# Patient Record
Sex: Male | Born: 1970 | Race: White | Hispanic: No | Marital: Single | Smoking: Former smoker
Health system: Southern US, Community
[De-identification: ages and names within clinical notes are randomized; demographics above are authoritative.]

## PROBLEM LIST (undated history)

## (undated) DIAGNOSIS — E119 Type 2 diabetes mellitus without complications: Secondary | ICD-10-CM

## (undated) DIAGNOSIS — K219 Gastro-esophageal reflux disease without esophagitis: Secondary | ICD-10-CM

## (undated) DIAGNOSIS — F329 Major depressive disorder, single episode, unspecified: Secondary | ICD-10-CM

## (undated) DIAGNOSIS — F419 Anxiety disorder, unspecified: Secondary | ICD-10-CM

## (undated) DIAGNOSIS — Z87442 Personal history of urinary calculi: Secondary | ICD-10-CM

## (undated) DIAGNOSIS — E785 Hyperlipidemia, unspecified: Secondary | ICD-10-CM

## (undated) DIAGNOSIS — R42 Dizziness and giddiness: Secondary | ICD-10-CM

## (undated) DIAGNOSIS — R0602 Shortness of breath: Secondary | ICD-10-CM

## (undated) DIAGNOSIS — F32A Depression, unspecified: Secondary | ICD-10-CM

## (undated) DIAGNOSIS — H8109 Meniere's disease, unspecified ear: Secondary | ICD-10-CM

## (undated) DIAGNOSIS — T4145XA Adverse effect of unspecified anesthetic, initial encounter: Secondary | ICD-10-CM

## (undated) HISTORY — DX: Hyperlipidemia, unspecified: E78.5

## (undated) HISTORY — DX: Personal history of urinary calculi: Z87.442

## (undated) HISTORY — DX: Dizziness and giddiness: R42

## (undated) HISTORY — DX: Major depressive disorder, single episode, unspecified: F32.9

## (undated) HISTORY — PX: HEMORROIDECTOMY: SUR656

## (undated) HISTORY — DX: Depression, unspecified: F32.A

## (undated) HISTORY — PX: EXTERNAL EAR SURGERY: SHX627

## (undated) HISTORY — DX: Anxiety disorder, unspecified: F41.9

## (undated) HISTORY — DX: Type 2 diabetes mellitus without complications: E11.9

---

## 1999-04-06 ENCOUNTER — Emergency Department (HOSPITAL_COMMUNITY): Admission: EM | Admit: 1999-04-06 | Discharge: 1999-04-07 | Payer: Self-pay | Admitting: Emergency Medicine

## 1999-04-06 ENCOUNTER — Encounter: Payer: Self-pay | Admitting: Emergency Medicine

## 1999-06-01 ENCOUNTER — Emergency Department (HOSPITAL_COMMUNITY): Admission: EM | Admit: 1999-06-01 | Discharge: 1999-06-01 | Payer: Self-pay | Admitting: Emergency Medicine

## 1999-06-13 ENCOUNTER — Emergency Department (HOSPITAL_COMMUNITY): Admission: EM | Admit: 1999-06-13 | Discharge: 1999-06-13 | Payer: Self-pay | Admitting: Emergency Medicine

## 2000-02-24 HISTORY — PX: ANAL FISSURE REPAIR: SHX2312

## 2000-07-26 HISTORY — PX: OTHER SURGICAL HISTORY: SHX169

## 2002-09-21 ENCOUNTER — Emergency Department (HOSPITAL_COMMUNITY): Admission: EM | Admit: 2002-09-21 | Discharge: 2002-09-21 | Payer: Self-pay | Admitting: Emergency Medicine

## 2002-09-21 ENCOUNTER — Encounter: Payer: Self-pay | Admitting: Emergency Medicine

## 2002-09-25 ENCOUNTER — Emergency Department (HOSPITAL_COMMUNITY): Admission: EM | Admit: 2002-09-25 | Discharge: 2002-09-25 | Payer: Self-pay | Admitting: Emergency Medicine

## 2003-06-26 DIAGNOSIS — T8859XA Other complications of anesthesia, initial encounter: Secondary | ICD-10-CM

## 2003-06-26 HISTORY — DX: Other complications of anesthesia, initial encounter: T88.59XA

## 2003-09-08 ENCOUNTER — Emergency Department (HOSPITAL_COMMUNITY): Admission: EM | Admit: 2003-09-08 | Discharge: 2003-09-08 | Payer: Self-pay | Admitting: Emergency Medicine

## 2004-03-25 ENCOUNTER — Emergency Department (HOSPITAL_COMMUNITY): Admission: EM | Admit: 2004-03-25 | Discharge: 2004-03-25 | Payer: Self-pay | Admitting: Emergency Medicine

## 2004-07-31 ENCOUNTER — Emergency Department: Payer: Self-pay | Admitting: Emergency Medicine

## 2004-08-02 ENCOUNTER — Ambulatory Visit: Payer: Self-pay | Admitting: Emergency Medicine

## 2005-01-19 ENCOUNTER — Ambulatory Visit: Payer: Self-pay | Admitting: Unknown Physician Specialty

## 2006-08-18 ENCOUNTER — Emergency Department (HOSPITAL_COMMUNITY): Admission: EM | Admit: 2006-08-18 | Discharge: 2006-08-18 | Payer: Self-pay | Admitting: Emergency Medicine

## 2006-08-23 ENCOUNTER — Ambulatory Visit: Payer: Self-pay | Admitting: Specialist

## 2006-08-23 HISTORY — PX: BRONCHOSCOPY: SUR163

## 2007-02-27 ENCOUNTER — Emergency Department: Payer: Self-pay | Admitting: Unknown Physician Specialty

## 2007-02-27 ENCOUNTER — Other Ambulatory Visit: Payer: Self-pay

## 2007-03-08 ENCOUNTER — Emergency Department: Payer: Self-pay | Admitting: Unknown Physician Specialty

## 2007-03-24 ENCOUNTER — Emergency Department: Payer: Self-pay | Admitting: Emergency Medicine

## 2007-03-31 ENCOUNTER — Emergency Department (HOSPITAL_COMMUNITY): Admission: EM | Admit: 2007-03-31 | Discharge: 2007-03-31 | Payer: Self-pay | Admitting: Emergency Medicine

## 2007-04-03 ENCOUNTER — Ambulatory Visit: Payer: Self-pay | Admitting: Gastroenterology

## 2007-04-15 ENCOUNTER — Encounter: Payer: Self-pay | Admitting: Gastroenterology

## 2007-04-15 ENCOUNTER — Ambulatory Visit (HOSPITAL_COMMUNITY): Admission: RE | Admit: 2007-04-15 | Discharge: 2007-04-15 | Payer: Self-pay | Admitting: Gastroenterology

## 2007-04-15 ENCOUNTER — Ambulatory Visit: Payer: Self-pay | Admitting: Gastroenterology

## 2007-04-15 HISTORY — PX: ESOPHAGOGASTRODUODENOSCOPY: SHX1529

## 2007-11-04 ENCOUNTER — Emergency Department: Payer: Self-pay | Admitting: Emergency Medicine

## 2007-12-19 ENCOUNTER — Emergency Department: Payer: Self-pay | Admitting: Emergency Medicine

## 2008-01-19 ENCOUNTER — Emergency Department (HOSPITAL_COMMUNITY): Admission: EM | Admit: 2008-01-19 | Discharge: 2008-01-19 | Payer: Self-pay | Admitting: Emergency Medicine

## 2008-01-22 ENCOUNTER — Emergency Department: Payer: Self-pay

## 2008-02-28 ENCOUNTER — Emergency Department: Payer: Self-pay

## 2008-03-18 ENCOUNTER — Ambulatory Visit (HOSPITAL_COMMUNITY): Admission: RE | Admit: 2008-03-18 | Discharge: 2008-03-18 | Payer: Self-pay | Admitting: Urology

## 2008-07-25 ENCOUNTER — Emergency Department: Payer: Self-pay | Admitting: Emergency Medicine

## 2009-05-06 ENCOUNTER — Emergency Department: Payer: Self-pay | Admitting: Emergency Medicine

## 2009-06-20 ENCOUNTER — Emergency Department: Payer: Self-pay | Admitting: Emergency Medicine

## 2010-10-13 ENCOUNTER — Emergency Department (HOSPITAL_COMMUNITY)
Admission: EM | Admit: 2010-10-13 | Discharge: 2010-10-13 | Disposition: A | Discharge status: Home / Self Care | Payer: Medicare Other | Attending: Emergency Medicine | Admitting: Emergency Medicine

## 2010-10-13 DIAGNOSIS — W260XXA Contact with knife, initial encounter: Secondary | ICD-10-CM | POA: Insufficient documentation

## 2010-10-13 DIAGNOSIS — S61209A Unspecified open wound of unspecified finger without damage to nail, initial encounter: Secondary | ICD-10-CM | POA: Insufficient documentation

## 2010-10-13 DIAGNOSIS — W261XXA Contact with sword or dagger, initial encounter: Secondary | ICD-10-CM | POA: Insufficient documentation

## 2010-10-23 ENCOUNTER — Emergency Department (HOSPITAL_COMMUNITY)
Admission: EM | Admit: 2010-10-23 | Discharge: 2010-10-23 | Disposition: A | Discharge status: Home / Self Care | Payer: Medicare Other | Attending: Emergency Medicine | Admitting: Emergency Medicine

## 2010-10-23 DIAGNOSIS — H8109 Meniere's disease, unspecified ear: Secondary | ICD-10-CM | POA: Insufficient documentation

## 2010-10-23 DIAGNOSIS — R319 Hematuria, unspecified: Secondary | ICD-10-CM | POA: Insufficient documentation

## 2010-10-23 DIAGNOSIS — R300 Dysuria: Secondary | ICD-10-CM | POA: Insufficient documentation

## 2010-10-23 DIAGNOSIS — K5289 Other specified noninfective gastroenteritis and colitis: Secondary | ICD-10-CM | POA: Insufficient documentation

## 2010-10-23 LAB — URINALYSIS, ROUTINE W REFLEX MICROSCOPIC
Leukocytes, UA: NEGATIVE
Nitrite: NEGATIVE
Specific Gravity, Urine: 1.03 (ref 1.005–1.030)
Urobilinogen, UA: 0.2 mg/dL (ref 0.0–1.0)
pH: 6 (ref 5.0–8.0)

## 2010-10-23 LAB — URINE MICROSCOPIC-ADD ON

## 2010-11-07 NOTE — H&P (Signed)
NAME:  Edwin Norton, Edwin Norton NO.:  [id number]   MEDICAL RECORD NO.:  [id number]          PATIENT TYPE:  AMB   LOCATION:  DAY                           FACILITY:  [facility]   PHYSICIAN:  Kassie Mends, M.D.      DATE OF BIRTH:  [birth year]   DATE OF ADMISSION:  DATE OF DISCHARGE:  LH                              HISTORY & PHYSICAL   CHIEF COMPLAINT:  1. Abdominal pain.  2. Nausea.  3. Vomiting.   HISTORY OF PRESENT ILLNESS:  Fredric is a 40 year old Caucasian gentleman  who presents with a 35-month history of burning upper abdominal pain.  He  states he has been seen in the emergency department at [facility] 3 times over the last month and was seen 3 days ago at [facility] emergency department.  He states he has had a negative CT and  ultrasound and lab work.  He was placed on Protonix 40 mg daily some  time in the last few weeks.  He has not noticed any improvement.  He  states he has only been able to eat solid food about 6 times in the last  4 weeks.  He has lost over 10 pounds.  He describes having this  epigastric burning type pain constantly.  He develops postprandial  vomiting frequently.  Denies any dysphagia or odynophagia.  He states  for about a year he has had postprandial urgency and frequent stools.  He has had reflux on and off in the past which he self-medicated with  Prilosec OTC with good results.  In February he states he had  hematemesis and was seen in the emergency department.  He describes  having an upper endoscopy and states this was done in [place].  He does  not recall the results.  He has never had a colonoscopy.  Denies any  melena.  He has a remote history of bright red blood per rectum which he  blames on hemorrhoids.  He has had a prior hemorrhoidectomy.  His stools  are generally regular.  He has not had as frequent stools lately, but he  is not eating that much.  He states that he is only able to keep  Gatorade down.  Denies  any urinary symptoms.  On March 31, 2007, he had  a hemoglobin of 16.7, white count 7000, platelet count 242,000.  MET 7  normal.  Total bilirubin 1.9.  Other LFT parameters normal.  Lipase was  31.  He had abdominal films which were unremarkable.   CURRENT MEDICATIONS:  1. Protonix 40 mg daily.  2. Percocet 5/325 mg every 4 to 6 hours as needed.  3. Xanax, generally takes 0.25 mg twice a day as needed.  Currently,      he ran out yesterday.   ALLERGIES:  No known drug allergies.   PAST MEDICAL HISTORY:  1. Meniere's disease diagnosed 5 to 6 years ago.  He is on disability      due to this.  2. Frequent dizziness.  3. Depression.  4. Anxiety.  5. History of  nephrolithiasis.  6. Surgery on his right ear.  7. Hemorrhoidectomy.   FAMILY HISTORY:  1. Mother has cirrhosis.  She is not real sure of the etiology.  She      is a patient of ours apparently, Matthew Cina, but has not been      seen in awhile.  2. Father deceased age 83 due to lung problems.  He also had peptic      ulcer disease.  3. No family history of colorectal cancer or inflammatory bowel      disease.   SOCIAL HISTORY:  1. He is single.  2. He is on disability.  3. He quit smoking 5 years ago.  4. He has been chewing tobacco for 2 years.  5. No alcohol use.   REVIEW OF SYSTEMS:  See HPI for GI.  CONSTITUTIONAL/CARDIOPULMONARY:  Denies chest pain, shortness of breath, cough or palpitations.  See HPI  for GU.   PHYSICAL EXAM:  Weight 202, height 5 feet 7 inches, temp 98.1, blood  pressure 110/82, pulse 52.  GENERAL:  A pleasant, somewhat anxious, young Caucasian male in no acute  distress.  Skin warm and dry, no jaundice.  HEENT:  Sclera nonicteric.  Oropharyngeal mucosa moist and pink.  No  lesions, erythema or exudate.  No lymphadenopathy, thyromegaly.  CHEST:  Lungs are clear to auscultation.  CARDIAC EXAM:  Regular rate and rhythm, normal S1 S2.  No murmurs, rubs  or gallops.  ABDOMEN:  Positive  bowel sounds.  Abdomen soft, nondistended.  He has  mild epigastric tenderness as well as tenderness pretty much throughout  the entire upper abdomen and with mid abdomen to deep palpation.  No  organomegaly or masses, no rebound tenderness or guarding.  No abdominal  bruits or hernias.  EXTREMITIES:  No edema.   IMPRESSION:  Hrithik is a 40 year old gentleman with a one month history  of upper abdominal burning associated with postprandial nausea and  vomiting.  He describes having unremarkable abdominal ultrasound and CT  of the abdomen as well as laboratories.  He has not had a response to  proton pump inhibitor thus far.  In February, I find it interesting that  he had hematemesis.  He describes having upper endoscopy and will try  and track down some records.  He definitely has a postprandial component  to his symptoms but complains of constant abdominal pain.  Symptoms not  entirely consistent with biliary disease, but cannot exclude biliary  dyskinesia.  Differential also includes gastritis, dyspepsia, peptic  ulcer disease.  Weight loss is concerning.  Would recommend EGD for  further evaluation.  At this point in time I really see no indication  for colonoscopy.   PLAN:  1. EGD in the near future by Dr. Cira Servant.  2. Retrieve records from [facility] including recent CT of the      abdomen and pelvis, abdominal ultrasound and if he has had an EGD      back earlier in the year.  3. Increase his Protonix to 40 mg b.i.d.  4. Refill his Percocet 5/325 mg, take one every 4 to 6 hours as      needed, #10, zero refills.  5. I elected to refill his Xanax, as he is completely out and has no      primary care physician.  He typically gets this from his ENT      doctor.  Xanax 0.25 mg, take one p.o. b.i.d. p.r.n., #30, zero  refills.  6. Further recommendations to follow.   ADDENDUM:  Mr. Matters should establish care with a primary physician. We  will not provide additional  Percocet or Xanax.      Tana Coast, P.A.      Kassie Mends, M.D.  Electronically Signed    LL/MEDQ  D:  04/03/2007  T:  04/03/2007  Job:  [id number]

## 2010-11-07 NOTE — Op Note (Signed)
NAME:  Edwin Norton, Edwin Norton NO.:  [id number]   MEDICAL RECORD NO.:  [id number]          PATIENT TYPE:  AMB   LOCATION:  DAY                           FACILITY:  [facility]   PHYSICIAN:  Kassie Mends, M.D.      DATE OF BIRTH:  18-Mar-1971   DATE OF PROCEDURE:  04/15/2007  DATE OF DISCHARGE:                               OPERATIVE REPORT   PROCEDURE:  Esophagogastroduodenoscopy with cold forceps biopsy.   INDICATION FOR EXAM:  Mr. Isabell is a 40 year old male who complains of  nausea, vomiting, abdominal pain.  He presented today with right sided  abdominal pain and mild chest pain.   FINDINGS:  1. Patchy erythema in antrum.  Biopsies obtained to evaluate for H.      pylori gastritis.  Otherwise normal stomach.  2. Normal esophagus without evidence of Barrett's mass, stricture,      erosion or ulceration.  3.  Normal duodenal bulb and second portion      of duodenum.   RECOMMENDATIONS:  1. Begin Prilosec 20 mg daily.  2. Avoid gastric irritants.  He was given a handout on gastric      irritants and gastritis.  3. We will call with the results of his biopsies to evaluate for H.      pylori gastritis or eosinophilic gastritis.  4. No aspirin or NSAIDs or anticoagulation for 5 days.  5. Follow up with Tana Coast in 6 weeks to reevaluate his vomiting,      and abdominal pain.  His vomiting and abdominal pain may be due to      gastroesophageal reflux disease and/or gastritis and has a low      likelihood of a biliary or pancreatic source.   MEDICATIONS:  1. Demerol 50 mg IV.  2. Versed 4 mg IV.  3. Phenergan 12.5 mg IV.   PROCEDURE TECHNIQUE:  Physical exam was performed.  Informed consent was  obtained from the patient after explaining benefits, risks and  alternatives to the procedure.  The patient was connected to monitor and  placed in left lateral position.  Continuous oxygen was provided by  nasal cannula and IV medicine administered through an indwelling  cannula.  After administration of sedation, the patient's esophagus  intubated.  The scope was advanced  under direct visualization to the second portion of the duodenum.  The  scope was removed slowly by carefully examining the color, texture,  anatomy and integrity of the mucosa on the way out.  The patient was  recovered in endoscopy and discharged home in satisfactory condition.      Kassie Mends, M.D.  Electronically Signed     SM/MEDQ  D:  04/15/2007  T:  04/16/2007  Job:  [id number]

## 2011-03-23 LAB — GC/CHLAMYDIA PROBE AMP, GENITAL: GC Probe Amp, Genital: NEGATIVE

## 2011-04-04 LAB — HEPATIC FUNCTION PANEL
Albumin: 3.8
Alkaline Phosphatase: 46
Bilirubin, Direct: <0.1
Total Bilirubin: 0.7

## 2011-04-05 LAB — URINALYSIS, ROUTINE W REFLEX MICROSCOPIC
Glucose, UA: NEGATIVE
Ketones, ur: 40 — AB
Protein, ur: NEGATIVE
Urobilinogen, UA: 0.2

## 2011-04-05 LAB — CBC
HCT: 48.9
MCV: 85.3
Platelets: 242
RDW: 13

## 2011-04-05 LAB — COMPREHENSIVE METABOLIC PANEL
Albumin: 4.6
BUN: 10
Creatinine, Ser: 1.15
Potassium: 4.7
Total Protein: 7.5

## 2011-04-05 LAB — DIFFERENTIAL
Lymphocytes Relative: 34
Monocytes Absolute: 0.6
Monocytes Relative: 9
Neutro Abs: 3.9

## 2011-04-05 LAB — PROTIME-INR: INR: 0.9

## 2011-05-13 ENCOUNTER — Emergency Department (HOSPITAL_COMMUNITY)
Admission: EM | Admit: 2011-05-13 | Discharge: 2011-05-13 | Disposition: A | Discharge status: Home / Self Care | Payer: Medicare Other | Attending: Emergency Medicine | Admitting: Emergency Medicine

## 2011-05-13 DIAGNOSIS — H8109 Meniere's disease, unspecified ear: Secondary | ICD-10-CM | POA: Insufficient documentation

## 2011-05-13 DIAGNOSIS — T1590XA Foreign body on external eye, part unspecified, unspecified eye, initial encounter: Secondary | ICD-10-CM | POA: Insufficient documentation

## 2011-05-13 DIAGNOSIS — Z23 Encounter for immunization: Secondary | ICD-10-CM | POA: Insufficient documentation

## 2011-05-13 HISTORY — DX: Meniere's disease, unspecified ear: H81.09

## 2011-05-13 MED ORDER — TOBRAMYCIN 0.3 % OP SOLN
1.0000 [drp] | Freq: Once | OPHTHALMIC | Status: AC
  Administered 2011-05-13: 1 [drp] via OPHTHALMIC

## 2011-05-13 MED ORDER — TETANUS-DIPHTH-ACELL PERTUSSIS 5-2.5-18.5 LF-MCG/0.5 IM SUSP
0.5000 mL | Freq: Once | INTRAMUSCULAR | Status: AC
  Administered 2011-05-13: 0.5 mL via INTRAMUSCULAR
  Filled 2011-05-13: qty 0.5

## 2011-05-13 MED ORDER — FLUORESCEIN SODIUM 1 MG OP STRP
1.0000 | ORAL_STRIP | Freq: Once | OPHTHALMIC | Status: AC
  Administered 2011-05-13: 21:00:00 via OPHTHALMIC

## 2011-05-13 MED ORDER — TETRACAINE HCL 0.5 % OP SOLN
2.0000 [drp] | Freq: Once | OPHTHALMIC | Status: AC
  Administered 2011-05-13: 2 [drp] via OPHTHALMIC
  Filled 2011-05-13: qty 2

## 2011-05-13 NOTE — ED Notes (Signed)
Pt presents with left eye pain. Eye is red and swollen. Pt states he was working under a car and thinks he may have a piece of metal in eye.

## 2011-05-13 NOTE — ED Notes (Signed)
Pt states was grinding metal while fixing a car and he felt "something go in the left eye".  Pt states that was at 0530 this morning, he went to sleep hoping it would come out.  Pt's left eye is swollen and red.

## 2011-05-13 NOTE — ED Provider Notes (Signed)
History     CSN: [id number] Arrival date & time: 05/13/2011  7:36 PM   First MD Initiated Contact with Patient 05/13/11 1942      Chief Complaint  Patient presents with  . Foreign Body in Eye    (Consider location/radiation/quality/duration/timing/severity/associated sxs/prior treatment) HPI Comments: Pt was working underneath a vehicle last PM and got something in his L eye.  Patient is a 40 y.o. male presenting with foreign body in eye. The history is provided by the patient. No language interpreter was used.  Foreign Body in Eye This is a new problem. The current episode started yesterday. The problem occurs constantly. Exacerbated by: blinking. Treatments tried: flushing eye with water. The treatment provided no relief.    Past Medical History  Diagnosis Date  . Meniere disease     History reviewed. No pertinent past surgical history.  No family history on file.  History  Substance Use Topics  . Smoking status: Never Smoker   . Smokeless tobacco: Not on file  . Alcohol Use: No      Review of Systems  Eyes: Positive for pain and redness. Negative for discharge, itching and visual disturbance.  All other systems reviewed and are negative.    Allergies  Percocet and Vicodin  Home Medications  No current outpatient prescriptions on file.  BP 131/87  Pulse 85  Temp(Src) 98.2 F (36.8 C) (Oral)  Resp 18  Ht 5\' 4"  (1.626 m)  Wt 220 lb (99.791 kg)  BMI 37.76 kg/m2  SpO2 100%  Physical Exam  Nursing note and vitals reviewed. Constitutional: He is oriented to person, place, and time. He appears well-developed and well-nourished.  HENT:  Head: Normocephalic and atraumatic.  Eyes: EOM are normal. Pupils are equal, round, and reactive to light. Right eye exhibits no discharge, no exudate and no hordeolum. Left eye exhibits no discharge, no exudate and no hordeolum. Foreign body present in the left eye. Right conjunctiva is not injected. Right conjunctiva has  no hemorrhage. Left conjunctiva is injected. Left conjunctiva has no hemorrhage. No scleral icterus. Pupils are equal.    Neck: Normal range of motion.  Cardiovascular: Normal rate, regular rhythm, normal heart sounds and intact distal pulses.   Pulmonary/Chest: Effort normal and breath sounds normal. No respiratory distress.  Abdominal: Soft. He exhibits no distension. There is no tenderness.  Musculoskeletal: Normal range of motion.  Neurological: He is alert and oriented to person, place, and time.  Skin: Skin is warm and dry.  Psychiatric: He has a normal mood and affect. Judgment normal.    ED Course  Procedures (including critical care time)  Labs Reviewed - No data to display No results found.   No diagnosis found.    MDM          Worthy Rancher, PA 05/13/11 2128

## 2011-05-14 NOTE — ED Provider Notes (Signed)
Medical screening examination/treatment/procedure(s) were performed by non-physician practitioner and as supervising physician I was immediately available for consultation/collaboration. Devoria Albe, MD, Armando Gang   Ward Givens, MD 05/14/11 [phone]

## 2011-05-15 ENCOUNTER — Emergency Department: Payer: Self-pay | Admitting: Emergency Medicine

## 2011-05-31 ENCOUNTER — Encounter: Payer: Self-pay | Admitting: Gastroenterology

## 2011-05-31 ENCOUNTER — Ambulatory Visit (INDEPENDENT_AMBULATORY_CARE_PROVIDER_SITE_OTHER): Payer: Medicare Other | Admitting: Gastroenterology

## 2011-05-31 ENCOUNTER — Telehealth: Payer: Self-pay | Admitting: Gastroenterology

## 2011-05-31 VITALS — BP 131/88 | HR 77 | Temp 98.5°F | Ht 66.0 in | Wt 220.8 lb

## 2011-05-31 DIAGNOSIS — K219 Gastro-esophageal reflux disease without esophagitis: Secondary | ICD-10-CM

## 2011-05-31 DIAGNOSIS — K644 Residual hemorrhoidal skin tags: Secondary | ICD-10-CM

## 2011-05-31 MED ORDER — HYDROCORTISONE 2.5 % RE CREA: TOPICAL_CREAM | RECTAL | Status: DC

## 2011-05-31 MED ORDER — OMEPRAZOLE 20 MG PO CPDR: 20.0000 mg | DELAYED_RELEASE_CAPSULE | Freq: Every day | ORAL | Status: DC

## 2011-05-31 NOTE — Progress Notes (Signed)
Primary Care Physician:  No primary provider on file. Primary Gastroenterologist:  Dr. Darrick Penna  Chief Complaint  Patient presents with  . Rectal Pain    HPI:   Edwin Norton is a 40 year old male who presents today secondary to rectal discomfort, itching, possible hemorrhoids. He reports a history of 2 procedures in the remote past at [facility] secondary to this same issue. 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 He denies any hematochezia. States rectum itches, painful to sit. States feels like something is sticking out of him. Has 1-3 stools per day, which is his baseline. Notes that this has been getting larger/longer over the years.  Also reports intermittent reflux, some at night, is up about 20 lbs per his report. Denies any N/V, dysphagia. Last EGD in 2008 as reported below. Taking Tagament prn, no PPI currently.   Past Medical History  Diagnosis Date  . Meniere disease   . Meniere disease   . Dizziness   . Depression   . Anxiety   . History of nephrolithiasis     Past Surgical History  Procedure Date  . Esophagogastroduodenoscopy 04/15/07    patchy erythema in antrum, negative H.pylori   . External ear surgery     right  . Hemorroidectomy   . Bronchoscopy 08/23/06    erythema was found in the left lower lobe  . Anal fissure repair Sept 2001    Dr. Theodis Sato, [facility]  . Debridment of chronic granulation tissue and seton placement Feb 2002    Dr. Theodis Sato, [facility], noted suprasphincteric fistula, with seton placement     Current Outpatient Prescriptions  Medication Sig Dispense Refill  . hydrocortisone (ANUSOL-HC) 2.5 % rectal cream Apply rectally 2 times daily  30 g  1  . omeprazole (PRILOSEC) 20 MG capsule Take 1 capsule (20 mg total) by mouth daily. Take 30 min before breakfast  30 capsule  1    Allergies as of 05/31/2011 -  Review Complete 05/31/2011  Allergen Reaction Noted  . Percocet (oxycodone-acetaminophen) Itching 05/13/2011  . Vicodin (hydrocodone-acetaminophen) Itching 05/13/2011    Family History  Problem Relation Age of Onset  . Colon cancer Neg Hx     History   Social History  . Marital Status: Single    Spouse Name: N/A    Number of Children: N/A  . Years of Education: N/A   Occupational History  . Not on file.   Social History Main Topics  . Smoking status: Never Smoker   . Smokeless tobacco: Not on file  . Alcohol Use: No  . Drug Use: No  . Sexually Active: Not on file   Other Topics Concern  . Not on file   Social History Narrative  . No narrative on file    Review of Systems: Gen: Denies any fever, chills, fatigue, weight loss, lack of appetite.  CV: Denies chest pain, heart palpitations, peripheral edema, syncope.  Resp: Denies shortness of breath at rest or with exertion. Denies wheezing or cough.  GI: Denies dysphagia or odynophagia. Denies jaundice, hematemesis, fecal incontinence. GU : Denies urinary burning, urinary frequency, urinary hesitancy MS: Denies joint pain, muscle weakness, cramps, or limitation of movement.  Derm: Denies rash, itching, dry skin Psych: Denies depression, anxiety, memory loss, and confusion Heme: Denies bruising, bleeding, and enlarged lymph nodes.  Physical Exam: BP 131/88  Pulse 77  Temp(Src) 98.5 F (36.9 C) (  Temporal)  Ht 5\' 6"  (1.676 m)  Wt 220 lb 12.8 oz (100.154 kg)  BMI 35.64 kg/m2 General:   Alert and oriented. Pleasant and cooperative. Well-nourished and well-developed.  Head:  Normocephalic and atraumatic. Eyes:  Without icterus, sclera clear and conjunctiva pink.  Ears:  Normal auditory acuity. Nose:  No deformity, discharge,  or lesions. Mouth:  No deformity or lesions, oral mucosa pink.  Neck:  Supple, without mass or thyromegaly. Lungs:  Clear to auscultation bilaterally. No wheezes, rales, or rhonchi. No  distress.  Heart:  S1, S2 present without murmurs appreciated.  Abdomen:  +BS, soft, non-tender and non-distended. No HSM noted. No guarding or rebound. No masses appreciated.  Rectal:  External exam with interesting finding of perianal growth, non-thrombosed, posterior and left of midline, with skin-tag like attachment to perianal area, on end size of large pea, ball-shaped, tender to touch, appears to have small scabs noted. Internal exam without mass noted, no significant discomfort, no gross blood noted.  Msk:  Symmetrical without gross deformities. Normal posture. Pulses:  Normal pulses noted. Extremities:  Without clubbing or edema. Neurologic:  Alert and  oriented x4;  grossly normal neurologically. Skin:  Intact without significant lesions or rashes. Cervical Nodes:  No significant cervical adenopathy. Psych:  Alert and cooperative. Normal mood and affect.

## 2011-05-31 NOTE — Patient Instructions (Signed)
Start taking Prilosec each morning, 30 minutes before breakfast.   Use the anusol cream twice a day. We have referred you to Dr. Lovell Sheehan for further assessment.  We are retrieving the notes from the past procedures you have had. If there have been some type of fistulas in the past, we may need to do a colonoscopy in the future.

## 2011-06-01 NOTE — Telephone Encounter (Signed)
Opened on era

## 2011-06-02 ENCOUNTER — Encounter: Payer: Self-pay | Admitting: Gastroenterology

## 2011-06-02 DIAGNOSIS — K644 Residual hemorrhoidal skin tags: Secondary | ICD-10-CM | POA: Insufficient documentation

## 2011-06-02 DIAGNOSIS — K219 Gastro-esophageal reflux disease without esophagitis: Secondary | ICD-10-CM | POA: Insufficient documentation

## 2011-06-02 NOTE — Assessment & Plan Note (Signed)
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 States painful to sit, no bleeding noted. Physical exam as outlined above. Will likely need procedure to remove this; due to hx of fistula in past, may need colonoscopy for evaluation of lower GI tract in future.   Will refer to Dr. Lovell Sheehan for assessment Brief course of anusol in interim for symptomatic relief F/U in 4 weeks for reassessment, consideration of colonoscopy

## 2011-06-02 NOTE — Assessment & Plan Note (Signed)
Hx of GERD, last EGD in 2008, no H.pylori noted. Notes nocturnal reflux, no PPI currently. Per pt has gained upwards of 20 lbs. Will place on Prilosec, follow-up in 4 weeks for reassessment. Discussed wt loss with pt as well.

## 2011-06-04 ENCOUNTER — Telehealth: Payer: Self-pay | Admitting: Gastroenterology

## 2011-06-04 NOTE — Progress Notes (Signed)
No PCP on file 

## 2011-06-04 NOTE — Telephone Encounter (Signed)
Received confirmation fax- pt is scheduled to see Dr Lovell Sheehan on 12/13 @ 10:45

## 2011-06-04 NOTE — Telephone Encounter (Signed)
Referral and notes faxed to Dr Jenkins 

## 2011-06-05 NOTE — Progress Notes (Signed)
REVIEWED.  EGD IF SX NOT IMPROVED ON PRILOSEC. LOW FAT DIET. LOSE 10-20 LBS.

## 2011-08-23 ENCOUNTER — Ambulatory Visit: Payer: Self-pay | Admitting: Unknown Physician Specialty

## 2011-09-19 ENCOUNTER — Ambulatory Visit: Payer: Self-pay | Admitting: Unknown Physician Specialty

## 2011-10-18 ENCOUNTER — Telehealth: Payer: Self-pay | Admitting: Gastroenterology

## 2011-10-18 ENCOUNTER — Encounter: Payer: Self-pay | Admitting: Urgent Care

## 2011-10-18 NOTE — Telephone Encounter (Signed)
Called pt to get more info. ( He has not been seen since Dec 2012). LMOM for a return call.

## 2011-10-18 NOTE — Telephone Encounter (Signed)
Pt's wife called back and said pt is having some rectal pain and it feels like something is crawling in his rectum. OV scheduled with KJ on 10/19/2011 @ 9:00 AM.

## 2011-10-18 NOTE — Telephone Encounter (Signed)
Patient is asking for a refill for Anusol until he can be seen in May with Dr. Darrick Penna please advise?

## 2011-10-19 ENCOUNTER — Ambulatory Visit: Payer: Medicare Other | Admitting: Urgent Care

## 2011-10-19 ENCOUNTER — Telehealth: Payer: Self-pay | Admitting: Urgent Care

## 2011-10-19 NOTE — Telephone Encounter (Signed)
Noted  

## 2011-10-19 NOTE — Telephone Encounter (Signed)
Pt was a no show

## 2011-10-22 ENCOUNTER — Encounter: Payer: Self-pay | Admitting: Urgent Care

## 2011-10-22 ENCOUNTER — Ambulatory Visit (INDEPENDENT_AMBULATORY_CARE_PROVIDER_SITE_OTHER): Payer: Medicare Other | Admitting: Urgent Care

## 2011-10-22 VITALS — BP 117/69 | HR 70 | Temp 98.3°F | Ht 67.0 in | Wt 229.8 lb

## 2011-10-22 DIAGNOSIS — L259 Unspecified contact dermatitis, unspecified cause: Secondary | ICD-10-CM

## 2011-10-22 DIAGNOSIS — L309 Dermatitis, unspecified: Secondary | ICD-10-CM

## 2011-10-22 MED ORDER — HYDROCORTISONE 2.5 % RE CREA: TOPICAL_CREAM | RECTAL | Status: DC

## 2011-10-22 NOTE — Progress Notes (Signed)
No PCP on file 

## 2011-10-22 NOTE — Progress Notes (Signed)
Primary Care Physician:  n/a Primary Gastroenterologist:  Dr. Darrick Penna  Chief Complaint  Patient presents with  . Rectal Pain    HPI:  Edwin Norton is a 41 y.o. male here for follow up for anal itching. He was seen by Gerrit Halls, nurse practitioner in December 2012. He was found to have a perianal skin tag and was sent to Dr. Lovell Sheehan however he admits to not following through with this referral. He states the Anusol cream helped significantly with his problems, but he ran out. He complains of intense anal pruritus. He began to have problems one week ago. He describes symptoms as constant. He denies any rectal bleeding or melena. He denies any change in his bowel habits.  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 He denies weight or appetite changes. No family history of colorectal carcinoma. Past Medical History  Diagnosis Date  . Meniere disease   . Meniere disease   . Dizziness   . Depression   . Anxiety   . History of nephrolithiasis     Past Surgical History  Procedure Date  . Esophagogastroduodenoscopy 04/15/07    patchy erythema in antrum, negative H.pylori   . External ear surgery     right  . Hemorroidectomy   . Bronchoscopy 08/23/06    erythema was found in the left lower lobe  . Anal fissure repair Sept 2001    Dr. Theodis Sato, [facility]  . Debridment of chronic granulation tissue and seton placement Feb 2002    Dr. Theodis Sato, [facility], noted suprasphincteric fistula, with seton placement     Current Outpatient Prescriptions  Medication Sig Dispense Refill  . hydrocortisone (ANUSOL-HC) 2.5 % rectal cream Apply rectally 2 times daily  30 g  0  . omeprazole (PRILOSEC) 20 MG capsule Take 1 capsule (20 mg total) by mouth daily. Take 30 min before breakfast  30 capsule  1    Allergies as of 10/22/2011 - Review Complete 10/22/2011  Allergen Reaction Noted  . Percocet  (oxycodone-acetaminophen) Itching 05/13/2011  . Vicodin (hydrocodone-acetaminophen) Itching 05/13/2011  Review of Systems: Gen: Denies any fever, chills, sweats, anorexia, fatigue, weakness, malaise, weight loss, and sleep disorder. CV: Denies chest pain, angina, palpitations, syncope, orthopnea, PND, peripheral edema, and claudication. Resp: Denies dyspnea at rest, dyspnea with exercise, cough, sputum, wheezing, coughing up blood, and pleurisy. GI: Denies vomiting blood, jaundice, and fecal incontinence.   Derm: Denies rash, hives, moles, warts, or unhealing ulcers.  Psych: Denies depression, anxiety, memory loss, suicidal ideation, hallucinations, paranoia, and confusion. Heme: Denies bruising, bleeding, and enlarged lymph nodes.  Physical Exam: BP 117/69  Pulse 70  Temp(Src) 98.3 F (36.8 C) (Temporal)  Ht 5\' 7"  (1.702 m)  Wt 229 lb 12.8 oz (104.237 kg)  BMI 35.99 kg/m2 General:   Alert,  Well-developed, obese, pleasant and cooperative in NAD Eyes:  Sclera clear, no icterus.   Conjunctiva pink. Mouth:  No deformity or lesions, oropharynx pink and moist. Neck:  Supple; no masses or thyromegaly. Heart:  Regular rate and rhythm; no murmurs, clicks, rubs,  or gallops. Abdomen:  Normal bowel sounds.  No bruits.  Soft, non-tender and non-distended without masses, hepatosplenomegaly or hernias noted.  No guarding or rebound tenderness.   Rectal:  1cm perianal skin tag, non-tender. Mildly Erythematous perianal skin without rash or discharge. No other external lesions visualized. Msk:  Symmetrical without gross deformities.  Pulses:  Normal pulses noted.  Extremities:  No clubbing or edema. Neurologic:  Alert and oriented x4;  grossly normal neurologically. Skin:  Intact without significant lesions or rashes.

## 2011-10-22 NOTE — Patient Instructions (Signed)
Use Anusol cream twice daily Dr Lovell Sheehan as planned

## 2011-10-22 NOTE — Assessment & Plan Note (Addendum)
Perianal dermatitis/pruritus ani with perianal skin tag.  His significant pruritus his external anus/perianal skin. No rectal bleeding.  No significant rash noted. Resend referral to Dr. Lovell Sheehan for skin tag removal/? Possible anoscopy.  Would consider colonoscopy should he develop bleeding or have persistent pruritus that is not resolved. Basic hygiene Anusol cream BID

## 2011-10-23 ENCOUNTER — Telehealth: Payer: Self-pay | Admitting: Gastroenterology

## 2011-10-23 NOTE — Telephone Encounter (Signed)
Pt is scheduled to see Dr Lovell Sheehan on 05/14 2:15- letter mailed to pt

## 2011-11-01 NOTE — Progress Notes (Signed)
REVIEWED.  

## 2011-11-06 NOTE — H&P (Signed)
  NTS SOAP Note  Vital Signs:  Vitals as of: 11/06/2011: Systolic 111: Diastolic 75: Heart Rate 78: Temp 98.65F: Height 25ft 6in: Weight 233Lbs 0 Ounces: OFC Not Entered: Respiratory Rate Not Entered: O2 Saturation Not Entered: Pain Level Not Entered: BMI 38  BMI : 37.61 kg/m2  Subjective: This 31 Years 59 Months old Male presents for of hemorrhoidal skin tag.  Has been present since undergoing hemorrhoid surgery at [facility] in 2001.  States it gets irritated when wiping himself.  Review of Symptoms:  Constitutional:unremarkable Head:unremarkable Eyes:unremarkable Nose/Mouth/Throat:unremarkable Cardiovascular:unremarkable Respiratory:unremarkable Gastrointestinal:unremarkable Genitourinary:unremarkable Musculoskeletal:unremarkable Skin:unremarkable Hematolgic/Lymphatic:unremarkable Allergic/Immunologic:unremarkable   Past Medical History:Reviewed   Past Medical History  Surgical History: hemorrhoidectomy, ear surgery, seton placement Medical Problems: Meniere's disease, depression, anxiety Allergies: percocet, vicodin Medications: omeprazole   Social History:Reviewed  Social History  Preferred Language: English ([place]) Race:  White Ethnicity: Not Hispanic / Latino Age: 41 Years 6 Months Marital Status:  S Alcohol: yes Recreational drug(s):  No   Smoking Status: Unknown if ever smoked reviewed on 11/06/2011  Family History:Reviewed   Family History  Is there a family history WU:[id number]    Objective Information: General:Well appearing, well nourished in no distress. Head:Atraumatic; no masses; no abnormalities Heart:RRR, no murmur Lungs:CTA bilaterally, no wheezes, rhonchi, rales.  Breathing unlabored. Abdomen:Soft, NT/ND, no HSM, no masses. No internal hemorrhoids noted.  Large external hemorrhoidal skin tag noted.  Assessment:Hemorrhoidal skin tag  Diagnosis  &amp; Procedure: DiagnosisCode: 455.9, ProcedureCode: 62130,    Plan:Scheduled for excision of hemorrhoidal skin tag on 11/14/11.   Patient Education:Alternative treatments to surgery were discussed with patient (and family).Risks and benefits  of procedure were fully explained to the patient (and family) who gave informed consent. Patient/family questions were addressed.  Follow-up:Pending Surgery

## 2011-11-07 ENCOUNTER — Encounter (HOSPITAL_COMMUNITY): Payer: Self-pay | Admitting: Pharmacy Technician

## 2011-11-07 NOTE — Patient Instructions (Signed)
[name]  11/07/2011   Your procedure is scheduled on:  5.22.13  Report to [facility] at 0700 AM.  Call this number if you have problems the morning of surgery: [phone]   Remember:   Do not eat food:After Midnight.  May have clear liquids:until Midnight .  Clear liquids include soda, tea, black coffee, apple or grape juice, broth.  Take these medicines the morning of surgery with A SIP OF WATER none   Do not wear jewelry, make-up or nail polish.  Do not wear lotions, powders, or perfumes. You may wear deodorant.  Do not shave 48 hours prior to surgery. Men may shave face and neck.  Do not bring valuables to the hospital.  Contacts, dentures or bridgework may not be worn into surgery.  Leave suitcase in the car. After surgery it may be brought to your room.  For patients admitted to the hospital, checkout time is 11:00 AM the day of discharge.   Patients discharged the day of surgery will not be allowed to drive home.  Name and phone number of your driver: family  Special Instructions: CHG Shower Use Special Wash: 1/2 bottle night before surgery and 1/2 bottle morning of surgery.   Please read over the following fact sheets that you were given: Pain Booklet, Surgical Site Infection Prevention, Anesthesia Post-op Instructions and Care and Recovery After Surgery   PATIENT INSTRUCTIONS POST-ANESTHESIA  IMMEDIATELY FOLLOWING SURGERY:  Do not drive or operate machinery for the first twenty four hours after surgery.  Do not make any important decisions for twenty four hours after surgery or while taking narcotic pain medications or sedatives.  If you develop intractable nausea and vomiting or a severe headache please notify your doctor immediately.  FOLLOW-UP:  Please make an appointment with your surgeon as instructed. You do not need to follow up with anesthesia unless specifically instructed to do so.  WOUND CARE INSTRUCTIONS (if applicable):  Keep a dry clean dressing on the  anesthesia/puncture wound site if there is drainage.  Once the wound has quit draining you may leave it open to air.  Generally you should leave the bandage intact for twenty four hours unless there is drainage.  If the epidural site drains for more than 36-48 hours please call the anesthesia department.  QUESTIONS?:  Please feel free to call your physician or the hospital operator if you have any questions, and they will be happy to assist you.        Anal Fissure, Adult An anal fissure is a small tear or crack in the skin around the anus. Bleeding from a fissure usually stops on its own within a few minutes. However, bleeding will often reoccur with each bowel movement until the crack heals.  CAUSES   Passing large, hard stools.   Frequent diarrheal stools.   Constipation.   Inflammatory bowel disease (Crohn's disease or ulcerative colitis).   Infections.   Anal sex.  SYMPTOMS   Small amounts of blood seen on your stools, on toilet paper, or in the toilet after a bowel movement.   Rectal bleeding.   Painful bowel movements.   Itching or irritation around the anus.  DIAGNOSIS Your caregiver will examine the anal area. An anal fissure can usually be seen with careful inspection. A rectal exam may be performed and a short tube (anoscope) may be used to examine the anal canal. TREATMENT   You may be instructed to take fiber supplements. These supplements can soften your  stool to help make bowel movements easier.   Sitz baths may be recommended to help heal the tear. Do not use soap in the sitz baths.   A medicated cream or ointment may be prescribed to lessen discomfort.  HOME CARE INSTRUCTIONS   Maintain a diet high in fruits, whole grains, and vegetables. Avoid constipating foods like bananas and dairy products.   Take sitz baths as directed by your caregiver.   Drink enough fluids to keep your urine clear or pale yellow.   Only take over-the-counter or prescription  medicines for pain, discomfort, or fever as directed by your caregiver. Do not take aspirin as this may increase bleeding.   Do not use ointments containing numbing medications (anesthetics) or hydrocortisone. They could slow healing.  SEEK MEDICAL CARE IF:   Your fissure is not completely healed within 3 days.   You have further bleeding.   You have a fever.   You have diarrhea mixed with blood.   You have pain.   Your problem is getting worse rather than better.  MAKE SURE YOU:   Understand these instructions.   Will watch your condition.   Will get help right away if you are not doing well or get worse.  Document Released: 06/11/2005 Document Revised: 05/31/2011 Document Reviewed: 11/26/2010 [facility] Patient Information 2012 [place], [place].

## 2011-11-08 ENCOUNTER — Ambulatory Visit: Payer: Medicare Other | Admitting: Gastroenterology

## 2011-11-08 ENCOUNTER — Encounter (HOSPITAL_COMMUNITY): Payer: Self-pay

## 2011-11-08 ENCOUNTER — Encounter (HOSPITAL_COMMUNITY)
Admission: RE | Admit: 2011-11-08 | Discharge: 2011-11-08 | Disposition: A | Discharge status: Home / Self Care | Payer: Medicare Other | Source: Ambulatory Visit | Attending: General Surgery | Admitting: General Surgery

## 2011-11-08 HISTORY — DX: Shortness of breath: R06.02

## 2011-11-08 HISTORY — DX: Adverse effect of unspecified anesthetic, initial encounter: T41.45XA

## 2011-11-08 LAB — CBC
MCH: 29.2 pg (ref 26.0–34.0)
MCHC: 34.8 g/dL (ref 30.0–36.0)
Platelets: 242 10*3/uL (ref 150–400)
RBC: 4.65 MIL/uL (ref 4.22–5.81)

## 2011-11-08 LAB — BASIC METABOLIC PANEL
Calcium: 9.5 mg/dL (ref 8.4–10.5)
GFR calc non Af Amer: 82 mL/min — ABNORMAL LOW (ref >90)
Glucose, Bld: 123 mg/dL — ABNORMAL HIGH (ref 70–99)
Sodium: 139 mEq/L (ref 135–145)

## 2011-11-08 LAB — DIFFERENTIAL
Basophils Relative: 0 % (ref 0–1)
Eosinophils Absolute: 0.1 10*3/uL (ref 0.0–0.7)
Lymphs Abs: 2.5 10*3/uL (ref 0.7–4.0)
Neutrophils Relative %: 45 % (ref 43–77)

## 2011-11-08 LAB — SURGICAL PCR SCREEN
MRSA, PCR: NEGATIVE
Staphylococcus aureus: NEGATIVE

## 2011-11-14 ENCOUNTER — Ambulatory Visit (HOSPITAL_COMMUNITY): Payer: Medicare Other | Admitting: Anesthesiology

## 2011-11-14 ENCOUNTER — Ambulatory Visit (HOSPITAL_COMMUNITY)
Admission: RE | Admit: 2011-11-14 | Discharge: 2011-11-14 | Disposition: A | Discharge status: Home / Self Care | Payer: Medicare Other | Source: Ambulatory Visit | Attending: General Surgery | Admitting: General Surgery

## 2011-11-14 ENCOUNTER — Encounter (HOSPITAL_COMMUNITY): Payer: Self-pay | Admitting: Anesthesiology

## 2011-11-14 ENCOUNTER — Encounter (HOSPITAL_COMMUNITY): Payer: Self-pay | Admitting: *Deleted

## 2011-11-14 ENCOUNTER — Encounter (HOSPITAL_COMMUNITY)
Admission: RE | Disposition: A | Discharge status: Home / Self Care | Payer: Self-pay | Source: Ambulatory Visit | Attending: General Surgery

## 2011-11-14 DIAGNOSIS — Z01812 Encounter for preprocedural laboratory examination: Secondary | ICD-10-CM | POA: Insufficient documentation

## 2011-11-14 DIAGNOSIS — K644 Residual hemorrhoidal skin tags: Secondary | ICD-10-CM | POA: Insufficient documentation

## 2011-11-14 HISTORY — PX: ANAL FISSURE REPAIR: SHX2312

## 2011-11-14 SURGERY — REPAIR, FISSURE, ANAL
Anesthesia: General | Site: Anus | Wound class: Clean

## 2011-11-14 MED ORDER — MIDAZOLAM HCL 2 MG/2ML IJ SOLN
1.0000 mg | INTRAMUSCULAR | Status: DC | PRN
  Administered 2011-11-14 (×2): 2 mg via INTRAVENOUS

## 2011-11-14 MED ORDER — ONDANSETRON HCL 4 MG/2ML IJ SOLN
INTRAMUSCULAR | Status: AC
  Administered 2011-11-14: 4 mg via INTRAVENOUS
  Filled 2011-11-14: qty 2

## 2011-11-14 MED ORDER — BUPIVACAINE HCL (PF) 0.5 % IJ SOLN
INTRAMUSCULAR | Status: AC
  Filled 2011-11-14: qty 30

## 2011-11-14 MED ORDER — BUPIVACAINE HCL (PF) 0.5 % IJ SOLN
INTRAMUSCULAR | Status: DC | PRN
  Administered 2011-11-14: 2 mL

## 2011-11-14 MED ORDER — DEXTROSE 5 % IV SOLN
INTRAVENOUS | Status: AC
  Filled 2011-11-14: qty 2

## 2011-11-14 MED ORDER — ONDANSETRON HCL 4 MG/2ML IJ SOLN: 4.0000 mg | Freq: Once | INTRAMUSCULAR | Status: DC | PRN

## 2011-11-14 MED ORDER — ONDANSETRON HCL 4 MG/2ML IJ SOLN
4.0000 mg | Freq: Once | INTRAMUSCULAR | Status: AC
  Administered 2011-11-14: 4 mg via INTRAVENOUS

## 2011-11-14 MED ORDER — LIDOCAINE HCL (PF) 1 % IJ SOLN
INTRAMUSCULAR | Status: AC
  Filled 2011-11-14: qty 5

## 2011-11-14 MED ORDER — KETOROLAC TROMETHAMINE 30 MG/ML IJ SOLN
INTRAMUSCULAR | Status: AC
  Administered 2011-11-14: 30 mg via INTRAVENOUS
  Filled 2011-11-14: qty 1

## 2011-11-14 MED ORDER — ARTIFICIAL TEARS OP OINT
TOPICAL_OINTMENT | OPHTHALMIC | Status: AC
  Filled 2011-11-14: qty 3.5

## 2011-11-14 MED ORDER — MIDAZOLAM HCL 2 MG/2ML IJ SOLN
INTRAMUSCULAR | Status: AC
  Administered 2011-11-14: 2 mg via INTRAVENOUS
  Filled 2011-11-14: qty 2

## 2011-11-14 MED ORDER — FENTANYL CITRATE 0.05 MG/ML IJ SOLN
INTRAMUSCULAR | Status: AC
  Filled 2011-11-14: qty 2

## 2011-11-14 MED ORDER — HYDROMORPHONE HCL 2 MG PO TABS: 2.0000 mg | ORAL_TABLET | ORAL | Status: AC | PRN

## 2011-11-14 MED ORDER — KETOROLAC TROMETHAMINE 30 MG/ML IJ SOLN
30.0000 mg | Freq: Once | INTRAMUSCULAR | Status: AC
  Administered 2011-11-14: 30 mg via INTRAVENOUS

## 2011-11-14 MED ORDER — SUCCINYLCHOLINE CHLORIDE 20 MG/ML IJ SOLN
INTRAMUSCULAR | Status: DC | PRN
  Administered 2011-11-14: 120 mg via INTRAVENOUS

## 2011-11-14 MED ORDER — DEXTROSE 5 % IV SOLN: 2.0000 g | INTRAVENOUS | Status: DC

## 2011-11-14 MED ORDER — PROPOFOL 10 MG/ML IV EMUL
INTRAVENOUS | Status: AC
  Filled 2011-11-14: qty 20

## 2011-11-14 MED ORDER — LACTATED RINGERS IV SOLN
INTRAVENOUS | Status: DC | PRN
  Administered 2011-11-14: 07:00:00 via INTRAVENOUS

## 2011-11-14 MED ORDER — CEFOXITIN SODIUM 1 G IV SOLR
1.0000 g | INTRAVENOUS | Status: DC | PRN
  Administered 2011-11-14: 2 g via INTRAVENOUS

## 2011-11-14 MED ORDER — FENTANYL CITRATE 0.05 MG/ML IJ SOLN: 25.0000 ug | INTRAMUSCULAR | Status: DC | PRN

## 2011-11-14 MED ORDER — 0.9 % SODIUM CHLORIDE (POUR BTL) OPTIME
TOPICAL | Status: DC | PRN
  Administered 2011-11-14: 1000 mL

## 2011-11-14 MED ORDER — LACTATED RINGERS IV SOLN
INTRAVENOUS | Status: DC
  Administered 2011-11-14: 1000 mL via INTRAVENOUS

## 2011-11-14 MED ORDER — PROPOFOL 10 MG/ML IV EMUL
INTRAVENOUS | Status: DC | PRN
  Administered 2011-11-14: 200 mg via INTRAVENOUS

## 2011-11-14 MED ORDER — LIDOCAINE HCL (CARDIAC) 10 MG/ML IV SOLN
INTRAVENOUS | Status: DC | PRN
  Administered 2011-11-14: 50 mg via INTRAVENOUS

## 2011-11-14 MED ORDER — FENTANYL CITRATE 0.05 MG/ML IJ SOLN
INTRAMUSCULAR | Status: DC | PRN
  Administered 2011-11-14: 100 ug via INTRAVENOUS

## 2011-11-14 MED ORDER — SUCCINYLCHOLINE CHLORIDE 20 MG/ML IJ SOLN
INTRAMUSCULAR | Status: AC
  Filled 2011-11-14: qty 1

## 2011-11-14 MED ORDER — KETOROLAC TROMETHAMINE 30 MG/ML IJ SOLN: 30.0000 mg | Freq: Once | INTRAMUSCULAR | Status: DC

## 2011-11-14 MED ORDER — LIDOCAINE VISCOUS 2 % MT SOLN
OROMUCOSAL | Status: DC | PRN
  Administered 2011-11-14: 1 via OROMUCOSAL

## 2011-11-14 MED ORDER — MIDAZOLAM HCL 2 MG/2ML IJ SOLN
INTRAMUSCULAR | Status: AC
  Filled 2011-11-14: qty 2

## 2011-11-14 MED ORDER — LIDOCAINE VISCOUS 2 % MT SOLN
OROMUCOSAL | Status: AC
  Filled 2011-11-14: qty 15

## 2011-11-14 SURGICAL SUPPLY — 28 items
BAG HAMPER (MISCELLANEOUS) ×2 IMPLANT
CLOTH BEACON ORANGE TIMEOUT ST (SAFETY) ×2 IMPLANT
COVER LIGHT HANDLE STERIS (MISCELLANEOUS) ×4 IMPLANT
DECANTER SPIKE VIAL GLASS SM (MISCELLANEOUS) ×2 IMPLANT
DRAPE PROXIMA HALF (DRAPES) ×2 IMPLANT
ELECT REM PT RETURN 9FT ADLT (ELECTROSURGICAL) ×2
ELECTRODE REM PT RTRN 9FT ADLT (ELECTROSURGICAL) ×1 IMPLANT
FORMALIN 10 PREFIL 480ML (MISCELLANEOUS) ×2 IMPLANT
GLOVE BIO SURGEON STRL SZ7.5 (GLOVE) ×2 IMPLANT
GLOVE BIOGEL PI IND STRL 7.5 (GLOVE) ×1 IMPLANT
GLOVE BIOGEL PI INDICATOR 7.5 (GLOVE) ×1
GLOVE ECLIPSE 7.0 STRL STRAW (GLOVE) ×2 IMPLANT
GLOVE EXAM NITRILE MD LF STRL (GLOVE) ×2 IMPLANT
GOWN STRL REIN XL XLG (GOWN DISPOSABLE) IMPLANT
HEMOSTAT SURGICEL 4X8 (HEMOSTASIS) ×2 IMPLANT
KIT ROOM TURNOVER APOR (KITS) ×2 IMPLANT
LUBRICANT JELLY 4.5OZ STERILE (MISCELLANEOUS) ×2 IMPLANT
MANIFOLD NEPTUNE II (INSTRUMENTS) ×2 IMPLANT
NEEDLE HYPO 25X1 1.5 SAFETY (NEEDLE) ×2 IMPLANT
NS IRRIG 1000ML POUR BTL (IV SOLUTION) ×2 IMPLANT
PACK PERI GYN (CUSTOM PROCEDURE TRAY) ×2 IMPLANT
PAD ARMBOARD 7.5X6 YLW CONV (MISCELLANEOUS) ×2 IMPLANT
SET BASIN LINEN APH (SET/KITS/TRAYS/PACK) ×2 IMPLANT
SPONGE GAUZE 4X4 12PLY (GAUZE/BANDAGES/DRESSINGS) ×2 IMPLANT
SUT SILK 0 FSL (SUTURE) IMPLANT
SUT VIC AB 2-0 CT2 27 (SUTURE) ×4 IMPLANT
SYR CONTROL 10ML LL (SYRINGE) ×2 IMPLANT
TUBE ANAEROBIC PORT A CUL  W/M (MISCELLANEOUS) IMPLANT

## 2011-11-14 NOTE — Interval H&P Note (Signed)
History and Physical Interval Note:  11/14/2011 8:34 AM  Edwin Norton  has presented today for surgery, with the diagnosis of Skin tag of anus   The various methods of treatment have been discussed with the patient and family. After consideration of risks, benefits and other options for treatment, the patient has consented to  Procedure(s) (LRB): ANAL FISSURE REPAIR (N/A) as a surgical intervention .  The patients' history has been reviewed, patient examined, no change in status, stable for surgery.  I have reviewed the patients' chart and labs.  Questions were answered to the patient's satisfaction.     Franky Macho A

## 2011-11-14 NOTE — Anesthesia Procedure Notes (Signed)
Procedure Name: Intubation Date/Time: 11/14/2011 8:49 AM Performed by: Carolyne Littles, Vontrell Pullman L Pre-anesthesia Checklist: Patient identified, Patient being monitored, Timeout performed, Emergency Drugs available and Suction available Patient Re-evaluated:Patient Re-evaluated prior to inductionOxygen Delivery Method: Circle System Utilized Preoxygenation: Pre-oxygenation with 100% oxygen Intubation Type: IV induction Ventilation: Mask ventilation without difficulty Grade View: Grade I Tube type: Oral Tube size: 7.0 mm Number of attempts: 1 Airway Equipment and Method: stylet and Video-laryngoscopy Placement Confirmation: ETT inserted through vocal cords under direct vision,  positive ETCO2 and breath sounds checked- equal and bilateral Secured at: 21 cm Tube secured with: Tape Dental Injury: Teeth and Oropharynx as per pre-operative assessment

## 2011-11-14 NOTE — Transfer of Care (Signed)
Immediate Anesthesia Transfer of Care Note  Patient: Edwin Norton  Procedure(s) Performed: Procedure(s) (LRB): ANAL FISSURE REPAIR (N/A)  Patient Location: PACU  Anesthesia Type: General  Level of Consciousness: awake, alert , oriented and patient cooperative  Airway & Oxygen Therapy: Patient Spontanous Breathing and Patient connected to face mask oxygen  Post-op Assessment: Report given to PACU RN and Post -op Vital signs reviewed and stable  Post vital signs: Reviewed and stable  Complications: No apparent anesthesia complications

## 2011-11-14 NOTE — Preoperative (Signed)
Beta Blockers   Reason not to administer Beta Blockers:Not Applicable 

## 2011-11-14 NOTE — Op Note (Signed)
Patient:  Edwin Norton  DOB:  03-30-1971  MRN:  [id number]   Preop Diagnosis:  External hemorrhoidal skin tag  Postop Diagnosis:  Same  Procedure:  Excision of external hemorrhoidal skin tag  Surgeon:  Franky Macho, M.D.  Anes:  General endotracheal  Indications:  Patient is a 41 year old white male who underwent a previous hemorrhoidectomy and apparent fistulotomy with seton placement at another facility the past he now presents with an external hemorrhoidal skin tag. The risks and benefits of the procedure were fully explained to the patient, gave informed consent.  Procedure note:  Patient is placed in the lithotomy position after induction of general endotracheal anesthesia. The perineum was prepped and draped using usual sterile technique with Betadine. Surgical site confirmation was performed.  On anoscopy, no significant internal hemorrhoidal disease was noted. An external hemorrhoidal skin tag was noted at the 5:00 position. This was excised and sent to pathology for further examination. The skin edges were reapproximated using 2-0 Vicryl interrupted sutures. Viscous Xylocaine was then placed on the wound. The wound is injected with 0.5% Sensorcaine.  All tape and needle counts were correct the end of the procedure. Patient was extubated in the operating room went back recovery room awake in stable condition.  Complications:   none  EBL:  None  Specimen:  External hemorrhoidal skin tag

## 2011-11-14 NOTE — Anesthesia Postprocedure Evaluation (Signed)
  Anesthesia Post-op Note  Patient: Edwin Norton  Procedure(s) Performed: Procedure(s) (LRB): ANAL FISSURE REPAIR (N/A)  Patient Location: PACU  Anesthesia Type: General  Level of Consciousness: awake, alert , oriented and patient cooperative  Airway and Oxygen Therapy: Patient Spontanous Breathing and Patient connected to face mask oxygen  Post-op Pain: mild  Post-op Assessment: Post-op Vital signs reviewed, Patient's Cardiovascular Status Stable, Respiratory Function Stable, Patent Airway and No signs of Nausea or vomiting  Post-op Vital Signs: Reviewed and stable  Complications: No apparent anesthesia complications

## 2011-11-14 NOTE — Anesthesia Preprocedure Evaluation (Signed)
Anesthesia Evaluation  Patient identified by MRN, date of birth, ID band Patient awake    Reviewed: Allergy & Precautions, H&P , NPO status , Patient's Chart, lab work & pertinent test results  Airway Mallampati: III      Dental  (+) Teeth Intact and Chipped   Pulmonary shortness of breath, sleep apnea ,  breath sounds clear to auscultation        Cardiovascular negative cardio ROS  Rhythm:Regular     Neuro/Psych PSYCHIATRIC DISORDERS Anxiety Depression Hx Meniere's Dx, mastoid shunt procedure.    GI/Hepatic GERD-  Medicated and Controlled,  Endo/Other  Morbid obesity  Renal/GU      Musculoskeletal   Abdominal   Peds  Hematology   Anesthesia Other Findings   Reproductive/Obstetrics                           Anesthesia Physical Anesthesia Plan  ASA: II  Anesthesia Plan: General   Post-op Pain Management:    Induction: Intravenous, Rapid sequence and Cricoid pressure planned  Airway Management Planned: Oral ETT  Additional Equipment:   Intra-op Plan:   Post-operative Plan: Extubation in OR  Informed Consent: I have reviewed the patients History and Physical, chart, labs and discussed the procedure including the risks, benefits and alternatives for the proposed anesthesia with the patient or authorized representative who has indicated his/her understanding and acceptance.     Plan Discussed with:   Anesthesia Plan Comments:         Anesthesia Quick Evaluation

## 2011-11-15 ENCOUNTER — Emergency Department (HOSPITAL_COMMUNITY)
Admission: EM | Admit: 2011-11-15 | Discharge: 2011-11-15 | Disposition: A | Discharge status: Home / Self Care | Payer: Medicare Other | Attending: Emergency Medicine | Admitting: Emergency Medicine

## 2011-11-15 ENCOUNTER — Encounter (HOSPITAL_COMMUNITY): Payer: Self-pay | Admitting: *Deleted

## 2011-11-15 DIAGNOSIS — J029 Acute pharyngitis, unspecified: Secondary | ICD-10-CM | POA: Insufficient documentation

## 2011-11-15 DIAGNOSIS — J392 Other diseases of pharynx: Secondary | ICD-10-CM

## 2011-11-15 MED ORDER — DIPHENHYD-HYDROCORT-NYSTATIN MT SUSP: OROMUCOSAL | Status: DC

## 2011-11-15 NOTE — ED Notes (Signed)
Had general anesthesia yesterday for  Removal of skin tag  Rectal area.  Sore throat  Since then with sweats.

## 2011-11-15 NOTE — ED Notes (Signed)
Cook MD at bedside. Discussing plan of care after examining pt.
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
## 2011-11-15 NOTE — ED Provider Notes (Signed)
History     CSN: [id number]  Arrival date & time 11/15/11  1259   First MD Initiated Contact with Patient 11/15/11 1320      Chief Complaint  Patient presents with  . Sore Throat    (Consider location/radiation/quality/duration/timing/severity/associated sxs/prior treatment) HPI Comments: Patient c/o sore throat with chills and sweats on the day after general anesthesia for removal of a skin tag.  C/o pain with swallowing.  He denies fever, swollen glands, vomiting or cough.    Patient is a 41 y.o. male presenting with pharyngitis. The history is provided by the patient.  Sore Throat This is a new problem. The current episode started yesterday. The problem occurs constantly. The problem has been unchanged. Associated symptoms include chills and a sore throat. Pertinent negatives include no abdominal pain, arthralgias, chest pain, congestion, coughing, fever, headaches, joint swelling, nausea, neck pain, numbness, rash, swollen glands, urinary symptoms, vomiting or weakness. The symptoms are aggravated by swallowing. He has tried nothing for the symptoms. The treatment provided no relief.    Past Medical History  Diagnosis Date  . Meniere disease   . Meniere disease   . Dizziness   . Depression   . Anxiety   . History of nephrolithiasis   . Complication of anesthesia 2005    does not recall name of meds. but makes his ears ring  . Shortness of breath     Past Surgical History  Procedure Date  . Esophagogastroduodenoscopy 04/15/07    patchy erythema in antrum, negative H.pylori   . External ear surgery     right  . Hemorroidectomy   . Bronchoscopy 08/23/06    erythema was found in the left lower lobe  . Anal fissure repair Sept 2001    Dr. Theodis Sato, [facility]  . Debridment of chronic granulation tissue and seton placement Feb 2002    Dr. Theodis Sato, [facility], noted suprasphincteric fistula, with seton placement     Family History  Problem  Relation Age of Onset  . Colon cancer Neg Hx     History  Substance Use Topics  . Smoking status: Former Games developer  . Smokeless tobacco: Not on file  . Alcohol Use: No      Review of Systems  Constitutional: Positive for chills. Negative for fever, activity change and appetite change.  HENT: Positive for sore throat. Negative for congestion, facial swelling, mouth sores, trouble swallowing, neck pain and neck stiffness.   Respiratory: Negative for cough.   Cardiovascular: Negative for chest pain.  Gastrointestinal: Negative for nausea, vomiting and abdominal pain.  Musculoskeletal: Negative for joint swelling and arthralgias.  Skin: Negative for rash.  Neurological: Negative for dizziness, weakness, numbness and headaches.  All other systems reviewed and are negative.    Allergies  Percocet and Vicodin  Home Medications   Current Outpatient Rx  Name Route Sig Dispense Refill  . ASPIRIN 325 MG PO TABS Oral Take 325 mg by mouth every morning.    Marland Kitchen HYDROMORPHONE HCL 2 MG PO TABS Oral Take 1 tablet (2 mg total) by mouth every 4 (four) hours as needed for pain. 40 tablet 0  . IBUPROFEN 200 MG PO TABS Oral Take 800 mg by mouth every 6 (six) hours as needed. FOR PAIN      BP 126/83  Pulse 57  Temp(Src) 97.7 F (36.5 C) (Oral)  Resp 20  Ht 5\' 6"  (1.676 m)  Wt 230 lb (104.327 kg)  BMI 37.12 kg/m2  SpO2 100%  Physical Exam  Nursing note and vitals reviewed. Constitutional: He is oriented to person, place, and time. He appears well-developed and well-nourished. No distress.  HENT:  Head: Normocephalic and atraumatic. No trismus in the jaw.  Right Ear: Tympanic membrane and ear canal normal.  Left Ear: Tympanic membrane and ear canal normal.  Mouth/Throat: Uvula is midline, oropharynx is clear and moist and mucous membranes are normal. No uvula swelling.  Neck: Normal range of motion. Neck supple.  Cardiovascular: Normal rate, regular rhythm and normal heart sounds.   No  murmur heard. Pulmonary/Chest: Effort normal and breath sounds normal.  Abdominal: Soft. He exhibits no distension. There is no tenderness. There is no rebound.  Musculoskeletal: He exhibits no edema and no tenderness.  Lymphadenopathy:    He has no cervical adenopathy.  Neurological: He is alert and oriented to person, place, and time. He exhibits normal muscle tone. Coordination normal.  Skin: Skin is warm and dry.    ED Course  Procedures (including critical care time)       MDM     Patient seen by EDP.  Airway is patent.  No edema , no cervical lymphadenopathy.  Non-toxic appearing.  Sore throat likely secondary to procedural sedation on the day prior to ED arrival.    Prescribed: Magic mouthwash   The patient appears reasonably screened and/or stabilized for discharge and I doubt any other medical condition or other [facility] requiring further screening, evaluation, or treatment in the ED at this time prior to discharge.   Patient / Family / Caregiver understand and agree with initial ED impression and plan with expectations set for ED visit. Pt stable in ED with no significant deterioration in condition. Pt feels improved after observation and/or treatment in ED.       Dineen Conradt L. Keyaan Lederman, PA 11/21/11 1300

## 2011-11-15 NOTE — Discharge Instructions (Signed)
 RESOURCE GUIDE  Dental Problems  Patients with Medicaid: [facility]                     [facility] [address]                                           [address] Phone:  [phone]                                                  Phone:  [phone]  If unable to pay or uninsured, contact:  [facility] or [facility] to become qualified for the adult dental clinic.  Chronic Pain Problems Contact  Chronic [facility] Clinic  [phone] Patients need to be referred by their primary care doctor.  Insufficient Money for Medicine Contact [employer]:  call "211" or [facility] 271-5999.  No Primary Care Doctor Call [facility]  832-8000 Other agencies that provide inexpensive medical care    [facility]  832-8035    [facility]  [phone]    [facility]  [phone]    Women's Clinic  [phone]    [facility]  [phone]    [facility]  [phone]  Psychological Services [facility]  [phone] [facility]  [phone] [facility]   800 853-5163 (emergency services 641-4993)  Substance Abuse Resources [facility]  336-882-2125 [facility] 336-784-9470 [facility] 336-285-9073 [facility] 336-845-3988 Residential & Outpatient Substance Abuse Program  800-659-3381  Abuse/Neglect [place] Child Abuse Hotline (336) 641-3795 [place] Child Abuse Hotline 800-378-5315 (After Hours)  Emergency Shelter [facility] (336) 271-5985  M[facility] (336) 275-9566 [facility] (704) 372-4663  MRSA Hotline #:   832-7006    [place] Resources  [facility] [place]     [employer]                          [facility] [address] [place]                       [address]      [address]  [place]                                                [place]                            [place] Phone:  [phone]                                   Phone:  [phone]                 Phone:  [phone]  [facility] Phone:    [phone]  [place] Child Abuse Hotline (336) 342-1394 (336) 342-3537 (After Hours)   

## 2011-11-16 ENCOUNTER — Encounter (HOSPITAL_COMMUNITY): Payer: Self-pay | Admitting: General Surgery

## 2011-11-25 NOTE — ED Provider Notes (Signed)
Medical screening examination/treatment/procedure(s) were performed by non-physician practitioner and as supervising physician I was immediately available for consultation/collaboration.  Chassidy Layson, MD 11/25/11 0025 

## 2011-12-09 ENCOUNTER — Encounter (HOSPITAL_COMMUNITY): Payer: Self-pay | Admitting: Emergency Medicine

## 2011-12-09 ENCOUNTER — Emergency Department (HOSPITAL_COMMUNITY): Payer: Medicare Other

## 2011-12-09 ENCOUNTER — Emergency Department (HOSPITAL_COMMUNITY)
Admission: EM | Admit: 2011-12-09 | Discharge: 2011-12-09 | Disposition: A | Discharge status: Home / Self Care | Payer: Medicare Other | Attending: Emergency Medicine | Admitting: Emergency Medicine

## 2011-12-09 DIAGNOSIS — H8109 Meniere's disease, unspecified ear: Secondary | ICD-10-CM | POA: Insufficient documentation

## 2011-12-09 DIAGNOSIS — S81809A Unspecified open wound, unspecified lower leg, initial encounter: Secondary | ICD-10-CM | POA: Insufficient documentation

## 2011-12-09 DIAGNOSIS — F329 Major depressive disorder, single episode, unspecified: Secondary | ICD-10-CM | POA: Insufficient documentation

## 2011-12-09 DIAGNOSIS — F3289 Other specified depressive episodes: Secondary | ICD-10-CM | POA: Insufficient documentation

## 2011-12-09 DIAGNOSIS — Z1810 Retained metal fragments, unspecified: Secondary | ICD-10-CM | POA: Insufficient documentation

## 2011-12-09 DIAGNOSIS — S90859A Superficial foreign body, unspecified foot, initial encounter: Secondary | ICD-10-CM

## 2011-12-09 DIAGNOSIS — IMO0002 Reserved for concepts with insufficient information to code with codable children: Secondary | ICD-10-CM

## 2011-12-09 DIAGNOSIS — F411 Generalized anxiety disorder: Secondary | ICD-10-CM | POA: Insufficient documentation

## 2011-12-09 DIAGNOSIS — S81009A Unspecified open wound, unspecified knee, initial encounter: Secondary | ICD-10-CM | POA: Insufficient documentation

## 2011-12-09 DIAGNOSIS — W298XXA Contact with other powered powered hand tools and household machinery, initial encounter: Secondary | ICD-10-CM | POA: Insufficient documentation

## 2011-12-09 DIAGNOSIS — Z87442 Personal history of urinary calculi: Secondary | ICD-10-CM | POA: Insufficient documentation

## 2011-12-09 MED ORDER — TRAMADOL-ACETAMINOPHEN 37.5-325 MG PO TABS: 1.0000 | ORAL_TABLET | ORAL | Status: AC | PRN

## 2011-12-09 MED ORDER — HYDROMORPHONE HCL PF 1 MG/ML IJ SOLN
1.0000 mg | Freq: Once | INTRAMUSCULAR | Status: AC
  Administered 2011-12-09: 1 mg via INTRAMUSCULAR
  Filled 2011-12-09 (×2): qty 1

## 2011-12-09 MED ORDER — ONDANSETRON 8 MG PO TBDP
8.0000 mg | ORAL_TABLET | Freq: Once | ORAL | Status: AC
  Administered 2011-12-09: 8 mg via ORAL
  Filled 2011-12-09 (×2): qty 1

## 2011-12-09 MED ORDER — HYDROMORPHONE HCL 4 MG PO TABS: 4.0000 mg | ORAL_TABLET | Freq: Two times a day (BID) | ORAL | Status: AC | PRN

## 2011-12-09 MED ORDER — LIDOCAINE HCL (PF) 1 % IJ SOLN
INTRAMUSCULAR | Status: AC
  Administered 2011-12-09: 20:00:00
  Filled 2011-12-09: qty 30

## 2011-12-09 MED ORDER — AMOXICILLIN-POT CLAVULANATE 875-125 MG PO TABS: 1.0000 | ORAL_TABLET | Freq: Two times a day (BID) | ORAL | Status: AC

## 2011-12-09 NOTE — ED Provider Notes (Signed)
History     CSN: [id number]  Arrival date & time 12/09/11  1623   First MD Initiated Contact with Patient 12/09/11 1649      Chief Complaint  Patient presents with  . Extremity Laceration  . Abrasion    (Consider location/radiation/quality/duration/timing/severity/associated sxs/prior treatment) HPI Comments: Patient complains of pain and laceration to the dorsal aspect of the left ankle that occurred when a piece of metal from a chain saw lacerated his ankle. Patient believes the piece of metal entered the skin of his ankle. He reports a brief episode of active bleeding that has resolved prior to ED arrival. He states that he was cutting brush at the time of the accident. He also has multiple superficial scratches to both lower extremities. He denies numbness, weakness, or swelling of his foot or ankle. He states that he received a tetanus injection last year.  Patient is a 41 y.o. male presenting with foreign body. The history is provided by the patient.  Foreign Body  The current episode started 1 to 2 hours ago. Intake: Soft tissue of the left ankle. Suspected object: Piece of metal. The incident was witnessed. The incident was witnessed/reported by the patient. Pertinent negatives include no fever and no difficulty breathing. Associated symptoms comments: Pain with movement or palpation of the dorsal left ankle. He has been behaving normally. There were no sick contacts. He has received no recent medical care.    Past Medical History  Diagnosis Date  . Meniere disease   . Meniere disease   . Dizziness   . Depression   . Anxiety   . History of nephrolithiasis   . Complication of anesthesia 2005    does not recall name of meds. but makes his ears ring  . Shortness of breath     Past Surgical History  Procedure Date  . Esophagogastroduodenoscopy 04/15/07    patchy erythema in antrum, negative H.pylori   . External ear surgery     right  . Hemorroidectomy   . Bronchoscopy  08/23/06    erythema was found in the left lower lobe  . Anal fissure repair Sept 2001    Dr. Theodis Sato, [facility]  . Debridment of chronic granulation tissue and seton placement Feb 2002    Dr. Theodis Sato, [facility], noted suprasphincteric fistula, with seton placement   . Anal fissure repair 11/14/2011    Procedure: ANAL FISSURE REPAIR;  Surgeon: Dalia Heading, MD;  Location: [facility];  Service: General;  Laterality: N/A;  Excision Skin Tag of Anus    Family History  Problem Relation Age of Onset  . Colon cancer Neg Hx     History  Substance Use Topics  . Smoking status: Former Smoker    Types: Cigarettes  . Smokeless tobacco: Not on file  . Alcohol Use: No      Review of Systems  Constitutional: Negative for fever and chills.  Genitourinary: Negative for dysuria and difficulty urinating.  Musculoskeletal: Positive for arthralgias and gait problem. Negative for back pain.  Skin: Positive for wound. Negative for color change.       laceration  Hematological: Does not bruise/bleed easily.  All other systems reviewed and are negative.    Allergies  Percocet and Vicodin  Home Medications   Current Outpatient Rx  Name Route Sig Dispense Refill  . ASPIRIN 325 MG PO TABS Oral Take 325 mg by mouth every morning.    . IBUPROFEN 200 MG PO TABS Oral Take 800  mg by mouth every 6 (six) hours as needed. FOR PAIN      BP 123/68  Pulse 85  Temp 98.2 F (36.8 C) (Oral)  Resp 16  Ht 5\' 6"  (1.676 m)  Wt 230 lb (104.327 kg)  BMI 37.12 kg/m2  SpO2 100%  Physical Exam  Nursing note and vitals reviewed. Constitutional: He is oriented to person, place, and time. He appears well-developed and well-nourished. No distress.  HENT:  Head: Normocephalic and atraumatic.  Cardiovascular: Normal rate, regular rhythm, normal heart sounds and intact distal pulses.   Pulmonary/Chest: Effort normal and breath sounds normal.  Musculoskeletal: He exhibits tenderness.        Left ankle: He exhibits decreased range of motion and laceration. He exhibits no swelling, no ecchymosis and normal pulse. No lateral malleolus, no medial malleolus, no posterior TFL, no head of 5th metatarsal and no proximal fibula tenderness found. Achilles tendon normal.       Feet:       ttp of the anterodorsal left ankle. 3 cm lac present. Multiple superficial abrasions also present to the bilateral LE"s.  DP pulse is brisk, sensation intact.  No erythema, bruising or deformity.    Neurological: He is alert and oriented to person, place, and time. He exhibits normal muscle tone. Coordination normal.  Skin: Skin is warm and dry.    ED Course  Procedures (including critical care time)  Labs Reviewed - No data to display Dg Ankle Complete Left  12/09/2011  *RADIOLOGY REPORT*  Clinical Data: Laceration of ankle with chainsaw.  LEFT ANKLE COMPLETE - 3+ VIEW  Comparison: None.  Findings: A curvilinear metallic foreign body is seen in the soft tissues along the dorsal aspect of the distal talus.  No evidence of fracture or dislocation.  No evidence of ankle joint effusion.  IMPRESSION:  1.  Curvilinear metallic foreign body adjacent to the dorsal aspect of the distal talus. 2.  No evidence of fracture.  Original Report Authenticated By: Danae Orleans, M.D.        MDM  Dilaudid IM and zofran ODT given during ED stay.     Consulted Dr. Romeo Apple.  He will review the images and call back.    1820  Dr. Romeo Apple will come to ED for further management of the patient's care.  Left foot remains NV intact.     FB removed by Dr. Romeo Apple in the ED.  Patient to follow-up with him in his office.       Naoko Diperna L. [place], [place] 12/09/11 1958

## 2011-12-09 NOTE — ED Notes (Signed)
Dr. Romeo Apple and Pauline Aus performing procedure to remove piece of washer/wire out of patient's left foot. Was in another room, came out and Dr. Romeo Apple was performing procedure, wash able to remove foreign body from foot, placed quick clot on foot. Time out was completed prior to procedure per MD.

## 2011-12-09 NOTE — ED Notes (Signed)
Patient states piece of medal from chainsaw went into top of left foot. Approx 2.5cm laceration noted. No active bleeding noted at this time. Patient states "It was shoot blood just a little bit ago." Multiple small abrasions noted on left shin.

## 2011-12-09 NOTE — Consult Note (Signed)
Reason for Consult: Foreign body left foot Referring Physician: Dr. Renae Gloss and PA Tammy Triplett  Edwin Norton is an 41 y.o. male.  HPI: 41 year old male [place] in discharge he wanted to cut them down and remove the wood. The saw would not start. He removed the cover and a piece of metal flew into his ankle area and foot area and he complained of severe pain.  In summary is complaining of severe pain over his left foot with painful range of motion especially dorsiflexion, the pain is nonradiating. It is sharp and throbbing Past Medical History  Diagnosis Date  . Meniere disease   . Meniere disease   . Dizziness   . Depression   . Anxiety   . History of nephrolithiasis   . Complication of anesthesia 2005    does not recall name of meds. but makes his ears ring  . Shortness of breath     Past Surgical History  Procedure Date  . Esophagogastroduodenoscopy 04/15/07    patchy erythema in antrum, negative H.pylori   . External ear surgery     right  . Hemorroidectomy   . Bronchoscopy 08/23/06    erythema was found in the left lower lobe  . Anal fissure repair Sept 2001    Dr. Theodis Sato, [facility]  . Debridment of chronic granulation tissue and seton placement Feb 2002    Dr. Theodis Sato, [facility], noted suprasphincteric fistula, with seton placement   . Anal fissure repair 11/14/2011    Procedure: ANAL FISSURE REPAIR;  Surgeon: Dalia Heading, MD;  Location: [facility];  Service: General;  Laterality: N/A;  Excision Skin Tag of Anus    Family History  Problem Relation Age of Onset  . Colon cancer Neg Hx     Social History:  reports that he has quit smoking. His smoking use included Cigarettes. He does not have any smokeless tobacco history on file. He reports that he does not drink alcohol or use illicit drugs.  Allergies:  Allergies  Allergen Reactions  . Percocet (Oxycodone-Acetaminophen) Itching  . Vicodin  (Hydrocodone-Acetaminophen) Itching    Medications: I have reviewed the patient's current medications.  No results found for this or any previous visit (from the past 48 hour(s)).  Dg Ankle Complete Left  12/09/2011  *RADIOLOGY REPORT*  Clinical Data: Laceration of ankle with chainsaw.  LEFT ANKLE COMPLETE - 3+ VIEW  Comparison: None.  Findings: A curvilinear metallic foreign body is seen in the soft tissues along the dorsal aspect of the distal talus.  No evidence of fracture or dislocation.  No evidence of ankle joint effusion.  IMPRESSION:  1.  Curvilinear metallic foreign body adjacent to the dorsal aspect of the distal talus. 2.  No evidence of fracture.  Original Report Authenticated By: Danae Orleans, M.D.    Review of Systems  Musculoskeletal: Positive for joint pain.  Neurological: Positive for dizziness.  All other systems reviewed and are negative.   Blood pressure 123/68, pulse 85, temperature 98.2 F (36.8 C), temperature source Oral, resp. rate 16, height 5\' 6"  (1.676 m), weight 104.327 kg (230 lb), SpO2 100.00%. Physical Exam  Constitutional: He is oriented to person, place, and time. He appears well-developed and well-nourished.  HENT:  Head: Normocephalic.  Eyes: Pupils are equal, round, and reactive to light.  Neck: Normal range of motion.  Cardiovascular: Normal rate.   Respiratory: Effort normal.  GI: Soft.  Musculoskeletal:  Upper extremities are normal  Right lower extremities  Normal is equivalent to no contracture subluxation atrophy tremor or malalignment  The left hip and knee are normal  The left foot has a puncture wound 10 point just distal to the joint with swelling tenderness and normal neurovascular exam. Motor exam normal.  Neurological: He is alert and oriented to person, place, and time. He has normal reflexes.  Skin: Skin is warm and dry.  Psychiatric: He has a normal mood and affect. His behavior is normal. Judgment and thought content  normal.    Assessment/Plan: X-rays show foreign body near the talonavicular area  From body left foot plan remove foreign body left foot  [name] 12/09/2011, 7:46 PM

## 2011-12-09 NOTE — ED Provider Notes (Signed)
Medical screening examination/treatment/procedure(s) were performed by non-physician practitioner and as supervising physician I was immediately available for consultation/collaboration.   Abrahim Sargent B. Bernette Mayers, MD 12/09/11 2029

## 2011-12-10 NOTE — Progress Notes (Signed)
Patient ID: Edwin Norton, male   DOB: [birth year], 41 y.o.   MRN: [id number] Date of procedure June 16  Preop procedure diagnosis foreign body left foot  Postop procedure diagnosis same  Procedure removal of foreign body left foot  Surgeon Romeo Apple  No assistants  Findings metal fragment left foot  Details of procedure after prep and drape of the foot the foot was anesthetized with local 1% plain lidocaine 20 cc.  An incision was made over the left foot in the area of the foreign body blunt dissection was carried out with protection of neurovascular structures an additional 5 cc of 1% plain lidocaine was placed in the deep tissues and the foreign body was found and removed.  Surgifoam was used to help with coagulation of subcutaneous bleeding. The wound is irrigated thoroughly and closed with 8 interrupted 3-0 nylon sutures  Surgical dressing was applied along with an Ace wrap  The patient will return to the office on Thursday for dressing change. He should use crutches and minimal weightbearing on his left lower extremity is placed on Augmentin 875 mg twice a day for 10 days with one refill  He has an allergy to codeine and codeine derivatives so was placed on tramadol and Tylenol if her pain control

## 2011-12-13 ENCOUNTER — Ambulatory Visit (INDEPENDENT_AMBULATORY_CARE_PROVIDER_SITE_OTHER): Payer: Medicare Other | Admitting: Orthopedic Surgery

## 2011-12-13 ENCOUNTER — Encounter: Payer: Self-pay | Admitting: Orthopedic Surgery

## 2011-12-13 VITALS — BP 100/60 | Ht 66.0 in | Wt 230.0 lb

## 2011-12-13 DIAGNOSIS — IMO0002 Reserved for concepts with insufficient information to code with codable children: Secondary | ICD-10-CM

## 2011-12-13 DIAGNOSIS — S90859A Superficial foreign body, unspecified foot, initial encounter: Secondary | ICD-10-CM

## 2011-12-13 NOTE — Progress Notes (Signed)
Patient ID: Edwin Norton, male   DOB: Dec 22, 1970, 41 y.o.   MRN: [id number] Chief Complaint  Patient presents with  . Wound Check    wound check left foot, DOI 12/09/11    BP 100/60  Ht 5\' 6"  (1.676 m)  Wt 230 lb (104.327 kg)  BMI 37.12 kg/m2  The wound check foreign body removal, LEFT foot on June 16. The incision is clean. The patient is taking as appropriate antibiotics Augmentin. His pain controlled with tramadol.  We placed a new dressing placed in a postop shoe and I will see him on the 26th of the sutures out

## 2011-12-13 NOTE — Patient Instructions (Addendum)
Walking is ok in the shoe   Stop using crutches   Continue antibiotics

## 2011-12-19 ENCOUNTER — Encounter: Payer: Self-pay | Admitting: Orthopedic Surgery

## 2011-12-19 ENCOUNTER — Ambulatory Visit (INDEPENDENT_AMBULATORY_CARE_PROVIDER_SITE_OTHER): Payer: Medicare Other | Admitting: Orthopedic Surgery

## 2011-12-19 VITALS — BP 120/60 | Ht 66.0 in | Wt 230.0 lb

## 2011-12-19 DIAGNOSIS — IMO0002 Reserved for concepts with insufficient information to code with codable children: Secondary | ICD-10-CM

## 2011-12-19 DIAGNOSIS — S90859A Superficial foreign body, unspecified foot, initial encounter: Secondary | ICD-10-CM

## 2011-12-19 NOTE — Progress Notes (Signed)
Patient ID: Edwin Norton, male   DOB: 1971/05/01, 41 y.o.   MRN: [id number] Chief Complaint  Patient presents with  . Follow-up    recheck left foot wound and suture removal, DOS 12/09/11     BP 120/60  Ht 5\' 6"  (1.676 m)  Wt 230 lb (104.327 kg)  BMI 37.12 kg/m2  Suture removal from the LEFT foot after foreign body removal.  No signs of infection. Patient is discharged to  normal activity

## 2011-12-19 NOTE — Patient Instructions (Signed)
activities as tolerated 

## 2012-02-07 ENCOUNTER — Encounter (HOSPITAL_COMMUNITY): Payer: Self-pay | Admitting: *Deleted

## 2012-02-07 ENCOUNTER — Emergency Department (HOSPITAL_COMMUNITY)
Admission: EM | Admit: 2012-02-07 | Discharge: 2012-02-08 | Disposition: A | Discharge status: Home / Self Care | Payer: Medicare Other | Attending: Emergency Medicine | Admitting: Emergency Medicine

## 2012-02-07 DIAGNOSIS — Z885 Allergy status to narcotic agent status: Secondary | ICD-10-CM | POA: Insufficient documentation

## 2012-02-07 DIAGNOSIS — R109 Unspecified abdominal pain: Secondary | ICD-10-CM | POA: Insufficient documentation

## 2012-02-07 DIAGNOSIS — K219 Gastro-esophageal reflux disease without esophagitis: Secondary | ICD-10-CM | POA: Insufficient documentation

## 2012-02-07 DIAGNOSIS — R1110 Vomiting, unspecified: Secondary | ICD-10-CM

## 2012-02-07 DIAGNOSIS — E860 Dehydration: Secondary | ICD-10-CM | POA: Insufficient documentation

## 2012-02-07 DIAGNOSIS — Z87891 Personal history of nicotine dependence: Secondary | ICD-10-CM | POA: Insufficient documentation

## 2012-02-07 LAB — CBC WITH DIFFERENTIAL/PLATELET
Basophils Relative: 0 % (ref 0–1)
Eosinophils Absolute: 0 10*3/uL (ref 0.0–0.7)
Eosinophils Relative: 0 % (ref 0–5)
MCH: 28.2 pg (ref 26.0–34.0)
MCHC: 34.3 g/dL (ref 30.0–36.0)
MCV: 82.3 fL (ref 78.0–100.0)
Neutrophils Relative %: 91 % — ABNORMAL HIGH (ref 43–77)
Platelets: 196 10*3/uL (ref 150–400)
RDW: 13.4 % (ref 11.5–15.5)

## 2012-02-07 LAB — URINE MICROSCOPIC-ADD ON

## 2012-02-07 LAB — URINALYSIS, ROUTINE W REFLEX MICROSCOPIC
Bilirubin Urine: NEGATIVE
Ketones, ur: NEGATIVE mg/dL
Nitrite: NEGATIVE
Protein, ur: NEGATIVE mg/dL
Specific Gravity, Urine: >1.03 — ABNORMAL HIGH (ref 1.005–1.030)
Urobilinogen, UA: 0.2 mg/dL (ref 0.0–1.0)

## 2012-02-07 LAB — COMPREHENSIVE METABOLIC PANEL
ALT: 24 U/L (ref 0–53)
Albumin: 3.8 g/dL (ref 3.5–5.2)
Alkaline Phosphatase: 67 U/L (ref 39–117)
Calcium: 8.9 mg/dL (ref 8.4–10.5)
GFR calc Af Amer: >90 mL/min (ref >90)
Potassium: 3.5 mEq/L (ref 3.5–5.1)
Sodium: 135 mEq/L (ref 135–145)
Total Protein: 7.1 g/dL (ref 6.0–8.3)

## 2012-02-07 MED ORDER — METOCLOPRAMIDE HCL 5 MG/ML IJ SOLN
10.0000 mg | Freq: Once | INTRAMUSCULAR | Status: AC
  Administered 2012-02-07: 10 mg via INTRAVENOUS
  Filled 2012-02-07: qty 2

## 2012-02-07 MED ORDER — ACETAMINOPHEN 325 MG PO TABS
650.0000 mg | ORAL_TABLET | Freq: Once | ORAL | Status: AC
  Administered 2012-02-07: 650 mg via ORAL
  Filled 2012-02-07: qty 2

## 2012-02-07 MED ORDER — SODIUM CHLORIDE 0.9 % IV SOLN
1000.0000 mL | Freq: Once | INTRAVENOUS | Status: AC
  Administered 2012-02-07: 1000 mL via INTRAVENOUS

## 2012-02-07 MED ORDER — DIPHENHYDRAMINE HCL 50 MG/ML IJ SOLN
50.0000 mg | Freq: Once | INTRAMUSCULAR | Status: AC
  Administered 2012-02-07: 50 mg via INTRAVENOUS
  Filled 2012-02-07: qty 1

## 2012-02-07 MED ORDER — SODIUM CHLORIDE 0.9 % IV SOLN
1000.0000 mL | INTRAVENOUS | Status: DC
  Administered 2012-02-07: 1000 mL via INTRAVENOUS

## 2012-02-07 MED ORDER — PANTOPRAZOLE SODIUM 40 MG IV SOLR
40.0000 mg | Freq: Once | INTRAVENOUS | Status: AC
  Administered 2012-02-07: 40 mg via INTRAVENOUS
  Filled 2012-02-07: qty 40

## 2012-02-07 NOTE — ED Notes (Signed)
Pain rt flank ,onset this am, Hx of kidney stones and pt says it feels like that.  N/V

## 2012-02-07 NOTE — ED Notes (Signed)
Patient reported he thought he was having a reaction to his medication. Patient stated he felt anxious, sweaty, shaky, and nervous. Assessed patient's vitals. Heart sounds are normal and breath sound clear and normal. Patient diaphoretic and with fever.

## 2012-02-07 NOTE — ED Notes (Signed)
Dr. Lynelle Doctor notified of patient's possible reaction to  Benadryl and the way it made him feel afterwards. Patient states can take tylenol without allergic reaction.

## 2012-02-07 NOTE — ED Provider Notes (Cosign Needed)
History  This chart was scribed for Ward Givens, MD by Bennett Scrape. This patient was seen in room [facility] and the patient's care was started at 8:38PM.  CSN: [id number]  Arrival date & time 02/07/12  1823   First MD Initiated Contact with Patient 02/07/12 2038      Chief Complaint  Patient presents with  . Flank Pain     Patient is a 41 y.o. male presenting with flank pain. The history is provided by the patient. No language interpreter was used.  Flank Pain This is a new problem. The current episode started 12 to 24 hours ago. The problem occurs constantly. Associated symptoms include abdominal pain. Pertinent negatives include no chest pain, no headaches and no shortness of breath.    NYZIER BOIVIN is a 41 y.o. male who presents to the Emergency Department complaining of approximately 14 hours of gradual onset, gradually worsening, constant abdominal pain described as a burning soreness that radiates into the right flank with associated fever of 101 (measured 7 hours ago), 30 episodes of non-bloody diarrhea described as watery and 15 episodes of non-bloody emesis (last episode 7 hours ago), difficulty urinating. Fever was measured at 100.5 in the ED. The pain is worse with rest and better with walking. He denies having any known sick contacts with similar symptoms. No know food contact, wife states she has eaten the same foods.  He has a h/o of kidney stones which he states he has never passed and reports that the symptoms are the same. He reports prior episodes of similar abdominal pain attributed to GERD which he takes Zantac for. He has a h/o Meniere disease and reports similar episodes of emesis and diarrhea. He also c/o constant dizziness from Meniere's as well but denies any recent changes. He denies neck pain, sore throat, visual disturbance, CP, cough, SOB, dysuria, back pain, HA, weakness, numbness and rash as associated symptoms. He is a former smoker but denies alcohol  use.   Pt is seen by [facility].  Past Medical History  Diagnosis Date  . Meniere disease   . Meniere disease   . Dizziness   . Depression   . Anxiety   . History of nephrolithiasis   . Complication of anesthesia 2005    does not recall name of meds. but makes his ears ring  . Shortness of breath     Past Surgical History  Procedure Date  . Esophagogastroduodenoscopy 04/15/07    patchy erythema in antrum, negative H.pylori   . External ear surgery     right  . Hemorroidectomy   . Bronchoscopy 08/23/06    erythema was found in the left lower lobe  . Anal fissure repair Sept 2001    Dr. Theodis Sato, [facility]  . Debridment of chronic granulation tissue and seton placement Feb 2002    Dr. Theodis Sato, [facility], noted suprasphincteric fistula, with seton placement   . Anal fissure repair 11/14/2011    Procedure: ANAL FISSURE REPAIR;  Surgeon: Dalia Heading, MD;  Location: [facility];  Service: General;  Laterality: N/A;  Excision Skin Tag of Anus    Family History  Problem Relation Age of Onset  . Colon cancer Neg Hx   . Diabetes      History  Substance Use Topics  . Smoking status: Former Smoker    Types: Cigarettes  . Smokeless tobacco: Not on file  . Alcohol Use: No  On disability for Meniere Disease  Review of Systems  Respiratory: Negative for shortness of breath.   Cardiovascular: Negative for chest pain.  Gastrointestinal: Positive for abdominal pain.  Genitourinary: Positive for flank pain.  Neurological: Negative for headaches.  All other systems reviewed and are negative.    Allergies  Percocet and Vicodin  Home Medications   Current Outpatient Rx  Name Route Sig Dispense Refill  . ACETAMINOPHEN 500 MG PO TABS Oral Take 1,000 mg by mouth once as needed. For fever    . IBUPROFEN 200 MG PO TABS Oral Take 400 mg by mouth every 6 (six) hours as needed. FOR PAIN    . TRAMADOL-ACETAMINOPHEN 37.5-325 MG PO TABS Oral Take  1-2 tablets by mouth every 6 (six) hours as needed. For pain      Triage Vitals: BP 119/77  Pulse 98  Temp 100 F (37.8 C) (Oral)  Resp 22  Ht 5\' 6"  (1.676 m)  Wt 220 lb (99.791 kg)  BMI 35.51 kg/m2  SpO2 100%  Vital signs normal except low-grade fever   Physical Exam  Nursing note and vitals reviewed. Constitutional: He is oriented to person, place, and time. He appears well-developed and well-nourished.  Non-toxic appearance. He does not appear ill. No distress.       Lying calmly on the stretcher  HENT:  Head: Normocephalic and atraumatic.  Right Ear: External ear normal.  Left Ear: External ear normal.  Nose: Nose normal. No mucosal edema or rhinorrhea.  Mouth/Throat: Oropharynx is clear and moist and mucous membranes are normal. No dental abscesses or uvula swelling.       Moist mucus membranes  Eyes: Conjunctivae and EOM are normal. Pupils are equal, round, and reactive to light.  Neck: Normal range of motion and full passive range of motion without pain. Neck supple. No tracheal deviation present.  Cardiovascular: Normal rate, regular rhythm and normal heart sounds.  Exam reveals no gallop and no friction rub.   No murmur heard. Pulmonary/Chest: Effort normal and breath sounds normal. No respiratory distress. He has no wheezes. He has no rhonchi. He has no rales. He exhibits no tenderness and no crepitus.  Abdominal: Soft. Normal appearance and bowel sounds are normal. He exhibits no distension. There is tenderness. There is no rebound and no guarding.       Mild diffuse tenderness but no guarding or rebound the  Musculoskeletal: Normal range of motion. He exhibits no edema and no tenderness.       Moves all extremities well.   Neurological: He is alert and oriented to person, place, and time. He has normal strength. No cranial nerve deficit.  Skin: Skin is warm, dry and intact. No rash noted. No erythema. No pallor.  Psychiatric: He has a normal mood and affect. His  speech is normal and behavior is normal. His mood appears not anxious.    ED Course  Procedures (including critical care time)   Medications  0.9 %  sodium chloride infusion (0 mL Intravenous Stopped 02/07/12 2353)    Followed by  0.9 %  sodium chloride infusion (1000 mL Intravenous New Bag/Given 02/07/12 2202)    Followed by  0.9 %  sodium chloride infusion (1000 mL Intravenous New Bag/Given 02/07/12 2202)  promethazine (PHENERGAN) 25 MG suppository (not administered)  metoCLOPramide (REGLAN) injection 10 mg (10 mg Intravenous Given 02/07/12 2201)  diphenhydrAMINE (BENADRYL) injection 50 mg (50 mg Intravenous Given 02/07/12 2201)  pantoprazole (PROTONIX) injection 40 mg (40 mg Intravenous Given 02/07/12 2201)  acetaminophen (TYLENOL) tablet 650  mg (650 mg Oral Given 02/07/12 2238)    DIAGNOSTIC STUDIES: Oxygen Saturation is 100% on room air, normal by my interpretation.    COORDINATION OF CARE: 9:21PM-Discussed treatment plan which includes Reglan and Benadryl with pt at bedside and pt agreed to plan. PT c/o a burning diffuse abdominal pain and was given protonix IV  Recheck 22:30 feeling better, still no urine output  Recheck 00:30 feeling better, getting 3rd liter of NS and feels much better.    Results for orders placed during the hospital encounter of 02/07/12  URINALYSIS, ROUTINE W REFLEX MICROSCOPIC      Component Value Range   Color, Urine YELLOW  YELLOW   APPearance CLOUDY (*) CLEAR   Specific Gravity, Urine >1.030 (*) 1.005 - 1.030   pH 6.0  5.0 - 8.0   Glucose, UA NEGATIVE  NEGATIVE mg/dL   Hgb urine dipstick TRACE (*) NEGATIVE   Bilirubin Urine NEGATIVE  NEGATIVE   Ketones, ur NEGATIVE  NEGATIVE mg/dL   Protein, ur NEGATIVE  NEGATIVE mg/dL   Urobilinogen, UA 0.2  0.0 - 1.0 mg/dL   Nitrite NEGATIVE  NEGATIVE   Leukocytes, UA NEGATIVE  NEGATIVE  CBC WITH DIFFERENTIAL      Component Value Range   WBC 10.2  4.0 - 10.5 K/uL   RBC 5.49  4.22 - 5.81 MIL/uL    Hemoglobin 15.5  13.0 - 17.0 g/dL   HCT 21.3  08.6 - 57.8 %   MCV 82.3  78.0 - 100.0 fL   MCH 28.2  26.0 - 34.0 pg   MCHC 34.3  30.0 - 36.0 g/dL   RDW 46.9  62.9 - 52.8 %   Platelets 196  150 - 400 K/uL   Neutrophils Relative 91 (*) 43 - 77 %   Neutro Abs 9.3 (*) 1.7 - 7.7 K/uL   Lymphocytes Relative 6 (*) 12 - 46 %   Lymphs Abs 0.6 (*) 0.7 - 4.0 K/uL   Monocytes Relative 3  3 - 12 %   Monocytes Absolute 0.3  0.1 - 1.0 K/uL   Eosinophils Relative 0  0 - 5 %   Eosinophils Absolute 0.0  0.0 - 0.7 K/uL   Basophils Relative 0  0 - 1 %   Basophils Absolute 0.0  0.0 - 0.1 K/uL  COMPREHENSIVE METABOLIC PANEL      Component Value Range   Sodium 135  135 - 145 mEq/L   Potassium 3.5  3.5 - 5.1 mEq/L   Chloride 100  96 - 112 mEq/L   CO2 25  19 - 32 mEq/L   Glucose, Bld 110 (*) 70 - 99 mg/dL   BUN 13  6 - 23 mg/dL   Creatinine, Ser 4.13  0.50 - 1.35 mg/dL   Calcium 8.9  8.4 - 24.4 mg/dL   Total Protein 7.1  6.0 - 8.3 g/dL   Albumin 3.8  3.5 - 5.2 g/dL   AST 18  0 - 37 U/L   ALT 24  0 - 53 U/L   Alkaline Phosphatase 67  39 - 117 U/L   Total Bilirubin 0.6  0.3 - 1.2 mg/dL   GFR calc non Af Amer 80 (*) >90 mL/min   GFR calc Af Amer >90  >90 mL/min  URINE MICROSCOPIC-ADD ON      Component Value Range   Squamous Epithelial / LPF RARE  RARE   WBC, UA 0-2  <3 WBC/hpf   RBC / HPF 0-2  <3 RBC/hpf   Bacteria,  UA MANY (*) RARE   Laboratory interpretation all normal except concentrated urine consistent with dehydration   Diagnoses that have been ruled out:  None  Diagnoses that are still under consideration:  None  Final diagnoses:  Vomiting and diarrhea  Dehydration  GERD (gastroesophageal reflux disease)    New Prescriptions   PROMETHAZINE (PHENERGAN) 25 MG SUPPOSITORY    Unwrap and insert 1 PR PRN nausea, vomiting     Plan discharge  Devoria Albe, MD, FACEP    MDM   I personally performed the services described in this documentation, which was scribed in my presence. The  recorded information has been reviewed and considered.  Devoria Albe, MD, FACEP    Ward Givens, MD 02/08/12 [phone]

## 2012-02-08 MED ORDER — PROMETHAZINE HCL 25 MG RE SUPP: RECTAL | Status: DC

## 2012-02-08 NOTE — ED Notes (Signed)
Pt alert & oriented x4, stable gait. Patient given discharge instructions, paperwork & prescription(s). Patient  instructed to stop at the registration desk to finish any additional paperwork. Patient verbalized understanding. Pt left department w/ no further questions. 

## 2012-05-17 ENCOUNTER — Encounter (HOSPITAL_COMMUNITY): Payer: Self-pay | Admitting: *Deleted

## 2012-05-17 ENCOUNTER — Emergency Department (HOSPITAL_COMMUNITY): Payer: Medicare Other

## 2012-05-17 ENCOUNTER — Emergency Department (HOSPITAL_COMMUNITY)
Admission: EM | Admit: 2012-05-17 | Discharge: 2012-05-17 | Disposition: A | Discharge status: Home / Self Care | Payer: Medicare Other | Attending: Emergency Medicine | Admitting: Emergency Medicine

## 2012-05-17 DIAGNOSIS — Z87891 Personal history of nicotine dependence: Secondary | ICD-10-CM | POA: Insufficient documentation

## 2012-05-17 DIAGNOSIS — R109 Unspecified abdominal pain: Secondary | ICD-10-CM | POA: Insufficient documentation

## 2012-05-17 DIAGNOSIS — R319 Hematuria, unspecified: Secondary | ICD-10-CM | POA: Insufficient documentation

## 2012-05-17 DIAGNOSIS — Z8659 Personal history of other mental and behavioral disorders: Secondary | ICD-10-CM | POA: Insufficient documentation

## 2012-05-17 DIAGNOSIS — Z9889 Other specified postprocedural states: Secondary | ICD-10-CM | POA: Insufficient documentation

## 2012-05-17 DIAGNOSIS — H8109 Meniere's disease, unspecified ear: Secondary | ICD-10-CM | POA: Insufficient documentation

## 2012-05-17 DIAGNOSIS — Z87442 Personal history of urinary calculi: Secondary | ICD-10-CM | POA: Insufficient documentation

## 2012-05-17 DIAGNOSIS — R110 Nausea: Secondary | ICD-10-CM | POA: Insufficient documentation

## 2012-05-17 LAB — URINALYSIS, ROUTINE W REFLEX MICROSCOPIC
Bilirubin Urine: NEGATIVE
Ketones, ur: NEGATIVE mg/dL
Leukocytes, UA: NEGATIVE
Nitrite: NEGATIVE
Specific Gravity, Urine: 1.01 (ref 1.005–1.030)
Urobilinogen, UA: 0.2 mg/dL (ref 0.0–1.0)
pH: 6 (ref 5.0–8.0)

## 2012-05-17 LAB — BASIC METABOLIC PANEL
BUN: 10 mg/dL (ref 6–23)
Calcium: 9.1 mg/dL (ref 8.4–10.5)
GFR calc Af Amer: >90 mL/min (ref >90)
GFR calc non Af Amer: >90 mL/min (ref >90)
Glucose, Bld: 97 mg/dL (ref 70–99)
Potassium: 3.6 mEq/L (ref 3.5–5.1)

## 2012-05-17 MED ORDER — TAMSULOSIN HCL 0.4 MG PO CAPS: 0.4000 mg | ORAL_CAPSULE | Freq: Every day | ORAL | Status: DC

## 2012-05-17 MED ORDER — KETOROLAC TROMETHAMINE 30 MG/ML IJ SOLN
15.0000 mg | Freq: Once | INTRAMUSCULAR | Status: AC
  Administered 2012-05-17: 15 mg via INTRAVENOUS
  Filled 2012-05-17: qty 1

## 2012-05-17 MED ORDER — ONDANSETRON 4 MG PO TBDP: 4.0000 mg | ORAL_TABLET | Freq: Three times a day (TID) | ORAL | Status: DC | PRN

## 2012-05-17 MED ORDER — HYDROMORPHONE HCL PF 1 MG/ML IJ SOLN
0.5000 mg | Freq: Once | INTRAMUSCULAR | Status: AC
  Administered 2012-05-17: 0.5 mg via INTRAVENOUS
  Filled 2012-05-17: qty 1

## 2012-05-17 MED ORDER — LACTATED RINGERS IV BOLUS (SEPSIS)
1000.0000 mL | Freq: Once | INTRAVENOUS | Status: AC
  Administered 2012-05-17: 1000 mL via INTRAVENOUS

## 2012-05-17 MED ORDER — PHENAZOPYRIDINE HCL 200 MG PO TABS: 200.0000 mg | ORAL_TABLET | Freq: Three times a day (TID) | ORAL | Status: DC

## 2012-05-17 MED ORDER — ONDANSETRON HCL 4 MG/2ML IJ SOLN
4.0000 mg | Freq: Once | INTRAMUSCULAR | Status: AC
  Administered 2012-05-17: 4 mg via INTRAVENOUS
  Filled 2012-05-17: qty 2

## 2012-05-17 MED ORDER — TRAMADOL HCL 50 MG PO TABS: 50.0000 mg | ORAL_TABLET | Freq: Three times a day (TID) | ORAL | Status: DC | PRN

## 2012-05-17 MED ORDER — IBUPROFEN 800 MG PO TABS: 800.0000 mg | ORAL_TABLET | Freq: Three times a day (TID) | ORAL | Status: DC

## 2012-05-17 NOTE — ED Notes (Signed)
Left flank pain began yesterday at 0830 with nausea at times. Hx of kidney stones.

## 2012-05-17 NOTE — ED Provider Notes (Signed)
History  This chart was scribed for Edwin Skene, MD by Erskine Emery, ED Scribe. This patient was seen in room [facility] and the patient's care was started at 13:23.   CSN: [id number]  Arrival date & time 05/17/12  1310   First MD Initiated Contact with Patient 05/17/12 1323      Chief Complaint  Patient presents with  . Flank Pain    (Consider location/radiation/quality/duration/timing/severity/associated sxs/prior treatment) The history is provided by the patient and the spouse. No language interpreter was used.  Edwin Norton is a 41 y.o. male who presents to the Emergency Department complaining of left-sided flank pain, gradually worsening since yesterday around 8:30am, of about a 9/10 severity now. Pt reports some associated difficulty urinating (despite drinking about a gallon or two of water/day), abdominal pain, nausea, and a loss of appetite. Pt denies any associated emesis, hematuria, dysuria, fevers, chills, diarrhea, rashes, joint aches, muscle pains, testicular pain, chest pain, SOB, cough, or ear pain, however the pt consistently has tinnitus from Meniere's disease and currently has some rhinorrhea.  Pt has a h/o kidney stones, the last of which was about 3 years ago. During that episode they did an x-ray and were able to visualize the stone. Pt also has a h/o diverticulitis. Pt reports taking Percocet makes him dizzy and makes his ears ring. Pt has been on Dilaudid for a previous foot and eye injury, and recently has been taking Tramadol.   Past Medical History  Diagnosis Date  . Meniere disease   . Meniere disease   . Dizziness   . Depression   . Anxiety   . History of nephrolithiasis   . Complication of anesthesia 2005    does not recall name of meds. but makes his ears ring  . Shortness of breath     Past Surgical History  Procedure Date  . Esophagogastroduodenoscopy 04/15/07    patchy erythema in antrum, negative H.pylori   . External ear surgery    right  . Hemorroidectomy   . Bronchoscopy 08/23/06    erythema was found in the left lower lobe  . Anal fissure repair Sept 2001    Dr. Theodis Sato, [facility]  . Debridment of chronic granulation tissue and seton placement Feb 2002    Dr. Theodis Sato, [facility], noted suprasphincteric fistula, with seton placement   . Anal fissure repair 11/14/2011    Procedure: ANAL FISSURE REPAIR;  Surgeon: Dalia Heading, MD;  Location: [facility];  Service: General;  Laterality: N/A;  Excision Skin Tag of Anus    Family History  Problem Relation Age of Onset  . Colon cancer Neg Hx   . Diabetes      History  Substance Use Topics  . Smoking status: Former Smoker    Types: Cigarettes  . Smokeless tobacco: Not on file  . Alcohol Use: No      Review of Systems At least 10pt or greater review of systems completed and are negative except where specified in the HPI.  Allergies  Percocet and Vicodin  Home Medications   Current Outpatient Rx  Name  Route  Sig  Dispense  Refill  . IBUPROFEN 200 MG PO TABS   Oral   Take 400 mg by mouth every 6 (six) hours as needed. FOR PAIN           Triage Vitals: BP 122/65  Pulse 66  Temp 97.9 F (36.6 C) (Oral)  Resp 16  Ht 5\' 6"  (1.676 m)  Wt 220 lb (99.791 kg)  BMI 35.51 kg/m2  SpO2 98%  Physical Exam  Nursing notes reviewed.  Electronic medical record reviewed. VITAL SIGNS:   Filed Vitals:   05/17/12 1321 05/17/12 1414  BP: 122/65 125/77  Pulse: 66 66  Temp: 97.9 F (36.6 C)   TempSrc: Oral   Resp: 16 18  Height: 5\' 6"  (1.676 m)   Weight: 220 lb (99.791 kg)   SpO2: 98% 99%   CONSTITUTIONAL: Awake, oriented, appears non-toxic HENT: Atraumatic, normocephalic, oral mucosa pink and moist, airway patent. Nares patent without drainage. External ears normal. EYES: Conjunctiva clear, EOMI, PERRLA NECK: Trachea midline, non-tender, supple CARDIOVASCULAR: Normal heart rate, Normal rhythm, No murmurs, rubs,  gallops PULMONARY/CHEST: Clear to auscultation, no rhonchi, wheezes, or rales. Symmetrical breath sounds. Non-tender. ABDOMINAL: Non-distended, soft, non-tender - no rebound or guarding.  BS normal. No tenderness to percussion of the flanks. NEUROLOGIC: Non-focal, moving all four extremities, no gross sensory or motor deficits. EXTREMITIES: No clubbing, cyanosis, or edema SKIN: Warm, Dry, No erythema, No rash  ED Course  Procedures (including critical care time) DIAGNOSTIC STUDIES: Oxygen Saturation is 98% on room air, normal by my interpretation.    COORDINATION OF CARE: 13:40--I evaluated the patient and we discussed a treatment plan including pain medication (Dilaudid), urinalysis, blood work, and abdominal x-ray to which the pt agreed.   13:45--Medication orders: Ketorolac (Toradol) 30 mg/mL injection 15 mg--once   Ondansetron (Zofran) injection 4 mg--once   Lactated ringers bolus 1,000 mL--once   Hydromorphone (Dilausis) injection 0.5 mg--once   Labs Reviewed  URINALYSIS, ROUTINE W REFLEX MICROSCOPIC - Abnormal; Notable for the following:    Hgb urine dipstick LARGE (*)     All other components within normal limits  URINE MICROSCOPIC-ADD ON  BASIC METABOLIC PANEL   Results for orders placed during the hospital encounter of 05/17/12  URINALYSIS, ROUTINE W REFLEX MICROSCOPIC      Component Value Range   Color, Urine YELLOW  YELLOW   APPearance CLEAR  CLEAR   Specific Gravity, Urine 1.010  1.005 - 1.030   pH 6.0  5.0 - 8.0   Glucose, UA NEGATIVE  NEGATIVE mg/dL   Hgb urine dipstick LARGE (*) NEGATIVE   Bilirubin Urine NEGATIVE  NEGATIVE   Ketones, ur NEGATIVE  NEGATIVE mg/dL   Protein, ur NEGATIVE  NEGATIVE mg/dL   Urobilinogen, UA 0.2  0.0 - 1.0 mg/dL   Nitrite NEGATIVE  NEGATIVE   Leukocytes, UA NEGATIVE  NEGATIVE  URINE MICROSCOPIC-ADD ON      Component Value Range   RBC / HPF 21-50  <3 RBC/hpf  BASIC METABOLIC PANEL      Component Value Range   Sodium 138  135 -  145 mEq/L   Potassium 3.6  3.5 - 5.1 mEq/L   Chloride 104  96 - 112 mEq/L   CO2 24  19 - 32 mEq/L   Glucose, Bld 97  70 - 99 mg/dL   BUN 10  6 - 23 mg/dL   Creatinine, Ser 6.57  0.50 - 1.35 mg/dL   Calcium 9.1  8.4 - 84.6 mg/dL   GFR calc non Af Amer >90  >90 mL/min   GFR calc Af Amer >90  >90 mL/min     1. Flank pain   2. Hematuria   3. Nausea alone   4. History of kidney stones       MDM  LAVELL SUPPLE is a 42 y.o. male with a history of kidney stones presenting  with left-sided colicky flank pain. Patient also has a history of diverticulosis. Patient has no tenderness the left lower quadrant. Patient has no tenderness to CVA percussion.  Urinalysis reveals microscopic hematuria consistent with renal colic. Did obtain x-rays of the abdomen looking for an obstructive stone however saw nothing. Patient's BUN and creatinine are within normal limits. I do not think the patient has an obstructing stone, is possible he has a new radiolucent stone however prior stones have been radiopaque. We'll treat him for kidney stones, patient says he does not do well with Percocet or other narcotic analgesics as they exacerbate his Mnire's and cause incessant ringing in his ears.  He says the Ultram has worked in the past we'll give him some of that, some ibuprofen, Zofran for nausea and Flomax or passing stone. Additionally give him some radium because he does have complaints of frequency.  Possible the patient is has passed the stone already in which case pyridium will be beneficial.  I do not think the patient has diverticulitis, would expect some tenderness in the left lower quadrant. Will not treat for that - even if it were diverticulitis causing his pain, as if from posterior facing diverticuli, without any tenderness it should be self-limiting.  I think this patient needs any further advanced imaging.  I explained the diagnosis and have given explicit precautions to return to the ER including  worsening pain despite medications, diarrhea, bloody stools or any other new or worsening symptoms. The patient understands and accepts the medical plan as it's been dictated and I have answered their questions. Discharge instructions concerning home care and prescriptions have been given.  The patient is STABLE and is discharged to home in good condition.   I personally performed the services described in this documentation, which was scribed in my presence. The recorded information has been reviewed and is accurate. Edwin Norton, M.D.        Edwin Skene, MD 05/17/12 1603

## 2012-08-07 ENCOUNTER — Emergency Department (HOSPITAL_COMMUNITY): Payer: Medicare Other

## 2012-08-07 ENCOUNTER — Encounter (HOSPITAL_COMMUNITY): Payer: Self-pay | Admitting: *Deleted

## 2012-08-07 ENCOUNTER — Emergency Department (HOSPITAL_COMMUNITY)
Admission: EM | Admit: 2012-08-07 | Discharge: 2012-08-08 | Disposition: A | Discharge status: Home / Self Care | Payer: Medicare Other | Attending: Emergency Medicine | Admitting: Emergency Medicine

## 2012-08-07 DIAGNOSIS — Z8659 Personal history of other mental and behavioral disorders: Secondary | ICD-10-CM | POA: Insufficient documentation

## 2012-08-07 DIAGNOSIS — R509 Fever, unspecified: Secondary | ICD-10-CM | POA: Insufficient documentation

## 2012-08-07 DIAGNOSIS — J189 Pneumonia, unspecified organism: Secondary | ICD-10-CM

## 2012-08-07 DIAGNOSIS — Z79899 Other long term (current) drug therapy: Secondary | ICD-10-CM | POA: Insufficient documentation

## 2012-08-07 DIAGNOSIS — Z87891 Personal history of nicotine dependence: Secondary | ICD-10-CM | POA: Insufficient documentation

## 2012-08-07 DIAGNOSIS — Z87442 Personal history of urinary calculi: Secondary | ICD-10-CM | POA: Insufficient documentation

## 2012-08-07 DIAGNOSIS — Z8669 Personal history of other diseases of the nervous system and sense organs: Secondary | ICD-10-CM | POA: Insufficient documentation

## 2012-08-07 DIAGNOSIS — J159 Unspecified bacterial pneumonia: Secondary | ICD-10-CM | POA: Insufficient documentation

## 2012-08-07 NOTE — ED Notes (Signed)
Pt reports woke about 2 hours ago with fever of aprox 103.  Pt states that he did take Aleve and Alkaseltzer at that time.  Reports chest congestion for past 3 days.  Reporting mildly productive cough.

## 2012-08-08 MED ORDER — MOXIFLOXACIN HCL 400 MG PO TABS: 400.0000 mg | ORAL_TABLET | Freq: Every day | ORAL | Status: DC

## 2012-08-08 MED ORDER — MOXIFLOXACIN HCL 400 MG PO TABS
ORAL_TABLET | ORAL | Status: AC
  Filled 2012-08-08: qty 1

## 2012-08-08 MED ORDER — LEVOFLOXACIN 750 MG PO TABS
750.0000 mg | ORAL_TABLET | Freq: Every day | ORAL | Status: DC
  Filled 2012-08-08: qty 1

## 2012-08-08 MED ORDER — NON FORMULARY: 400.0000 mg | Freq: Once | Status: DC

## 2012-08-08 NOTE — ED Provider Notes (Signed)
History     CSN: [id number]  Arrival date & time 08/07/12  2331   First MD Initiated Contact with Patient 08/07/12 2339      Chief Complaint  Patient presents with  . Fever  . Cough    (Consider location/radiation/quality/duration/timing/severity/associated sxs/prior treatment) Patient is a 42 y.o. male presenting with cough. The history is provided by the patient (pt complains of cough and fever).  Cough Cough characteristics:  Non-productive Severity:  Moderate Onset quality:  Gradual Timing:  Constant Progression:  Worsening Chronicity:  New Smoker: no   Context: animal exposure   Relieved by:  Nothing Worsened by:  Deep breathing Associated symptoms: no chest pain, no eye discharge, no headaches and no rash     Past Medical History  Diagnosis Date  . Meniere disease   . Meniere disease   . Dizziness   . Depression   . Anxiety   . History of nephrolithiasis   . Complication of anesthesia 2005    does not recall name of meds. but makes his ears ring  . Shortness of breath     Past Surgical History  Procedure Laterality Date  . Esophagogastroduodenoscopy  04/15/07    patchy erythema in antrum, negative H.pylori   . External ear surgery      right  . Hemorroidectomy    . Bronchoscopy  08/23/06    erythema was found in the left lower lobe  . Anal fissure repair  Sept 2001    Dr. Theodis Sato, [facility]  . 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 Theodis Sato, [facility], noted suprasphincteric fistula, with seton placement   . Anal fissure repair  11/14/2011    Procedure: ANAL FISSURE REPAIR;  Surgeon: Dalia Heading, MD;  Location: [facility];  Service: General;  Laterality: N/A;  Excision Skin Tag of Anus    Family History  Problem Relation Age of Onset  . Colon cancer Neg Hx   . Diabetes      History  Substance Use Topics  . Smoking status: Former Smoker    Types: Cigarettes  . Smokeless tobacco:  Not on file  . Alcohol Use: No      Review of Systems  Constitutional: Negative for fatigue.  HENT: Negative for congestion, sinus pressure and ear discharge.   Eyes: Negative for discharge.  Respiratory: Positive for cough.   Cardiovascular: Negative for chest pain.  Gastrointestinal: Negative for abdominal pain and diarrhea.  Genitourinary: Negative for frequency and hematuria.  Musculoskeletal: Negative for back pain.  Skin: Negative for rash.  Neurological: Negative for seizures and headaches.  Psychiatric/Behavioral: Negative for hallucinations.    Allergies  Percocet and Vicodin  Home Medications   Current Outpatient Rx  Name  Route  Sig  Dispense  Refill  . ibuprofen (ADVIL,MOTRIN) 200 MG tablet   Oral   Take 400 mg by mouth every 6 (six) hours as needed. FOR PAIN         . ibuprofen (ADVIL,MOTRIN) 800 MG tablet   Oral   Take 1 tablet (800 mg total) by mouth 3 (three) times daily.   21 tablet   0   . moxifloxacin (AVELOX) 400 MG tablet   Oral   Take 1 tablet (400 mg total) by mouth daily.   7 tablet   0   . ondansetron (ZOFRAN ODT) 4 MG disintegrating tablet   Oral   Take 1 tablet (4 mg total) by mouth  every 8 (eight) hours as needed for nausea.   20 tablet   0   . phenazopyridine (PYRIDIUM) 200 MG tablet   Oral   Take 1 tablet (200 mg total) by mouth 3 (three) times daily.   6 tablet   0   . Tamsulosin HCl (FLOMAX) 0.4 MG CAPS   Oral   Take 1 capsule (0.4 mg total) by mouth daily.   5 capsule   0   . traMADol (ULTRAM) 50 MG tablet   Oral   Take 1-2 tablets (50-100 mg total) by mouth every 8 (eight) hours as needed for pain.   15 tablet   0     BP 131/72  Pulse 99  Temp(Src) 99.8 F (37.7 C) (Oral)  Ht 5\' 6"  (1.676 m)  Wt 225 lb (102.059 kg)  BMI 36.33 kg/m2  SpO2 98%  Physical Exam  Constitutional: He is oriented to person, place, and time. He appears well-developed.  HENT:  Head: Normocephalic and atraumatic.  Eyes:  Conjunctivae and EOM are normal. No scleral icterus.  Neck: Neck supple. No thyromegaly present.  Cardiovascular: Normal rate and regular rhythm.  Exam reveals no gallop and no friction rub.   No murmur heard. Pulmonary/Chest: No stridor. He has no wheezes. He has no rales. He exhibits no tenderness.  Abdominal: He exhibits no distension. There is no tenderness. There is no rebound.  Musculoskeletal: Normal range of motion. He exhibits no edema.  Lymphadenopathy:    He has no cervical adenopathy.  Neurological: He is oriented to person, place, and time. Coordination normal.  Skin: No rash noted. No erythema.  Psychiatric: He has a normal mood and affect. His behavior is normal.    ED Course  Procedures (including critical care time)  Labs Reviewed - No data to display Dg Chest 2 View  08/08/2012  *RADIOLOGY REPORT*  Clinical Data: Cough and fever.  CHEST - 2 VIEW  Comparison: Chest radiograph performed 03/25/2004  Findings: The lungs are well-aerated.  Mild middle lobe or lingular opacity is suggested on the lateral view, which could reflect mild pneumonia.  There is no evidence of pleural effusion or pneumothorax.  The heart is normal in size; the mediastinal contour is within normal limits.  No acute osseous abnormalities are seen.  IMPRESSION: Mild middle lobe or lingular opacity suggested on the lateral view, which could reflect mild pneumonia.   Original Report Authenticated By: Tonia Ghent, M.D.      1. Community acquired pneumonia       MDM          Benny Lennert, MD 08/08/12 [phone]

## 2012-08-08 NOTE — ED Notes (Signed)
avelox 400mg  po given per order.

## 2012-08-10 ENCOUNTER — Emergency Department: Payer: Self-pay | Admitting: Emergency Medicine

## 2012-08-10 LAB — CBC
HCT: 43.9 % (ref 40.0–52.0)
MCHC: 34.1 g/dL (ref 32.0–36.0)
MCV: 83 fL (ref 80–100)
Platelet: 216 10*3/uL (ref 150–440)
RBC: 5.32 10*6/uL (ref 4.40–5.90)
WBC: 7.4 10*3/uL (ref 3.8–10.6)

## 2012-08-10 LAB — COMPREHENSIVE METABOLIC PANEL
Albumin: 3.7 g/dL (ref 3.4–5.0)
Anion Gap: 10 (ref 7–16)
BUN: 10 mg/dL (ref 7–18)
Bilirubin,Total: 0.3 mg/dL (ref 0.2–1.0)
Calcium, Total: 8.3 mg/dL — ABNORMAL LOW (ref 8.5–10.1)
Co2: 23 mmol/L (ref 21–32)
Creatinine: 1.02 mg/dL (ref 0.60–1.30)
EGFR (African American): >60
EGFR (Non-African Amer.): >60
Osmolality: 280 (ref 275–301)
SGOT(AST): 26 U/L (ref 15–37)
SGPT (ALT): 46 U/L (ref 12–78)
Sodium: 140 mmol/L (ref 136–145)
Total Protein: 7.2 g/dL (ref 6.4–8.2)

## 2012-09-05 ENCOUNTER — Emergency Department (HOSPITAL_COMMUNITY)
Admission: EM | Admit: 2012-09-05 | Discharge: 2012-09-05 | Discharge status: Against Medical Advice | Payer: Medicare Other | Attending: Emergency Medicine | Admitting: Emergency Medicine

## 2012-09-05 DIAGNOSIS — Z5320 Procedure and treatment not carried out because of patient's decision for unspecified reasons: Secondary | ICD-10-CM | POA: Insufficient documentation

## 2013-01-28 ENCOUNTER — Other Ambulatory Visit (HOSPITAL_COMMUNITY): Payer: Self-pay | Admitting: Physician Assistant

## 2013-01-28 DIAGNOSIS — H5330 Unspecified disorder of binocular vision: Secondary | ICD-10-CM

## 2013-01-28 DIAGNOSIS — E291 Testicular hypofunction: Secondary | ICD-10-CM

## 2013-01-28 DIAGNOSIS — R51 Headache: Secondary | ICD-10-CM

## 2013-02-03 ENCOUNTER — Other Ambulatory Visit (HOSPITAL_COMMUNITY): Payer: Self-pay | Admitting: Physician Assistant

## 2013-02-03 ENCOUNTER — Ambulatory Visit (HOSPITAL_COMMUNITY)
Admission: RE | Admit: 2013-02-03 | Discharge: 2013-02-03 | Disposition: A | Discharge status: Home / Self Care | Payer: Medicare Other | Source: Ambulatory Visit | Attending: Physician Assistant | Admitting: Physician Assistant

## 2013-02-03 DIAGNOSIS — H5330 Unspecified disorder of binocular vision: Secondary | ICD-10-CM

## 2013-02-03 DIAGNOSIS — Q048 Other specified congenital malformations of brain: Secondary | ICD-10-CM | POA: Insufficient documentation

## 2013-02-03 DIAGNOSIS — R51 Headache: Secondary | ICD-10-CM

## 2013-02-03 DIAGNOSIS — E291 Testicular hypofunction: Secondary | ICD-10-CM

## 2013-02-03 MED ORDER — GADOBENATE DIMEGLUMINE 529 MG/ML IV SOLN
10.0000 mL | Freq: Once | INTRAVENOUS | Status: AC | PRN
  Administered 2013-02-03: 10 mL via INTRAVENOUS

## 2013-02-17 ENCOUNTER — Telehealth (HOSPITAL_COMMUNITY): Payer: Self-pay | Admitting: Dietician

## 2013-02-17 NOTE — Telephone Encounter (Signed)
Received voicemail left at 1225. Called back at 1245. No answer; voicemail has not been set up yet. Will reattempt.

## 2013-02-17 NOTE — Telephone Encounter (Signed)
Called back at 1516. Scheduled for 02/19/13 DM class at 1730.

## 2013-02-19 ENCOUNTER — Encounter (HOSPITAL_COMMUNITY): Payer: Self-pay | Admitting: Dietician

## 2013-02-19 NOTE — Progress Notes (Signed)
[facility] Diabetes Class Completion  Date:February 19, 2013  Time: 1730  Pt attended [facility]'s Diabetes [facility] Education Class on February 19, 2013.   Patient was educated on the following topics:   -Survival skills (signs and symptoms of hyperglycemia and hypoglycemia, treatment for hypoglycemia, ideal levels for fasting and postprandial blood sugars, goal Hgb A1c level, foot care basics)  -Recommendations for physical activity   -Carbohydrate metabolism in relation to diabetes   -Meal planning (sources of carbohydrate, carbohydrate counting, meal planning strategies, food label reading, and portion control).  Handouts provided:  -"Diabetes and You: Taking Charge of Your Health"  -"Carbohydrate Counting and Meal Planning"  -"Blood Sugar Diary"  -"Your Guide to Better Office Visits"   Solangel Mcmanaway A. Daleen Steinhaus, RD, LDN   

## 2013-12-25 ENCOUNTER — Emergency Department (HOSPITAL_COMMUNITY): Payer: Medicare Other

## 2013-12-25 ENCOUNTER — Emergency Department (HOSPITAL_COMMUNITY)
Admission: EM | Admit: 2013-12-25 | Discharge: 2013-12-25 | Disposition: A | Discharge status: Home / Self Care | Payer: Medicare Other | Attending: Emergency Medicine | Admitting: Emergency Medicine

## 2013-12-25 ENCOUNTER — Encounter (HOSPITAL_COMMUNITY): Payer: Self-pay | Admitting: Emergency Medicine

## 2013-12-25 DIAGNOSIS — R112 Nausea with vomiting, unspecified: Secondary | ICD-10-CM | POA: Diagnosis present

## 2013-12-25 DIAGNOSIS — Z8669 Personal history of other diseases of the nervous system and sense organs: Secondary | ICD-10-CM | POA: Insufficient documentation

## 2013-12-25 DIAGNOSIS — F3289 Other specified depressive episodes: Secondary | ICD-10-CM | POA: Insufficient documentation

## 2013-12-25 DIAGNOSIS — R197 Diarrhea, unspecified: Secondary | ICD-10-CM | POA: Insufficient documentation

## 2013-12-25 DIAGNOSIS — Z87891 Personal history of nicotine dependence: Secondary | ICD-10-CM | POA: Insufficient documentation

## 2013-12-25 DIAGNOSIS — Z79899 Other long term (current) drug therapy: Secondary | ICD-10-CM | POA: Diagnosis not present

## 2013-12-25 DIAGNOSIS — F411 Generalized anxiety disorder: Secondary | ICD-10-CM | POA: Insufficient documentation

## 2013-12-25 DIAGNOSIS — R51 Headache: Secondary | ICD-10-CM | POA: Diagnosis not present

## 2013-12-25 DIAGNOSIS — Z8719 Personal history of other diseases of the digestive system: Secondary | ICD-10-CM | POA: Insufficient documentation

## 2013-12-25 DIAGNOSIS — F329 Major depressive disorder, single episode, unspecified: Secondary | ICD-10-CM | POA: Insufficient documentation

## 2013-12-25 DIAGNOSIS — R1084 Generalized abdominal pain: Secondary | ICD-10-CM | POA: Diagnosis not present

## 2013-12-25 DIAGNOSIS — Z87442 Personal history of urinary calculi: Secondary | ICD-10-CM | POA: Diagnosis not present

## 2013-12-25 HISTORY — DX: Gastro-esophageal reflux disease without esophagitis: K21.9

## 2013-12-25 LAB — COMPREHENSIVE METABOLIC PANEL
ALK PHOS: 80 U/L (ref 39–117)
ALT: 30 U/L (ref 0–53)
AST: 20 U/L (ref 0–37)
Albumin: 4.1 g/dL (ref 3.5–5.2)
Anion gap: 12 (ref 5–15)
BUN: 14 mg/dL (ref 6–23)
CO2: 26 meq/L (ref 19–32)
Calcium: 9.3 mg/dL (ref 8.4–10.5)
Chloride: 104 mEq/L (ref 96–112)
Creatinine, Ser: 1.09 mg/dL (ref 0.50–1.35)
GFR, EST NON AFRICAN AMERICAN: 82 mL/min — AB (ref >90)
GLUCOSE: 118 mg/dL — AB (ref 70–99)
POTASSIUM: 4.5 meq/L (ref 3.7–5.3)
Sodium: 142 mEq/L (ref 137–147)
Total Bilirubin: 0.6 mg/dL (ref 0.3–1.2)
Total Protein: 7.7 g/dL (ref 6.0–8.3)

## 2013-12-25 LAB — CBC WITH DIFFERENTIAL/PLATELET
BASOS ABS: 0 10*3/uL (ref 0.0–0.1)
Basophils Relative: 0 % (ref 0–1)
Eosinophils Absolute: 0 10*3/uL (ref 0.0–0.7)
Eosinophils Relative: 0 % (ref 0–5)
HCT: 50 % (ref 39.0–52.0)
Hemoglobin: 17.4 g/dL — ABNORMAL HIGH (ref 13.0–17.0)
LYMPHS ABS: 0.7 10*3/uL (ref 0.7–4.0)
Lymphocytes Relative: 5 % — ABNORMAL LOW (ref 12–46)
MCH: 29.2 pg (ref 26.0–34.0)
MCHC: 34.8 g/dL (ref 30.0–36.0)
MCV: 83.9 fL (ref 78.0–100.0)
Monocytes Absolute: 0.6 10*3/uL (ref 0.1–1.0)
Monocytes Relative: 4 % (ref 3–12)
NEUTROS ABS: 12 10*3/uL — AB (ref 1.7–7.7)
NEUTROS PCT: 91 % — AB (ref 43–77)
Platelets: 205 10*3/uL (ref 150–400)
RBC: 5.96 MIL/uL — AB (ref 4.22–5.81)
RDW: 13.5 % (ref 11.5–15.5)
WBC: 13.3 10*3/uL — AB (ref 4.0–10.5)

## 2013-12-25 LAB — LIPASE, BLOOD: Lipase: 40 U/L (ref 11–59)

## 2013-12-25 MED ORDER — PROMETHAZINE HCL 25 MG PO TABS: 25.0000 mg | ORAL_TABLET | Freq: Four times a day (QID) | ORAL | Status: DC | PRN

## 2013-12-25 MED ORDER — DICYCLOMINE HCL 10 MG/ML IM SOLN
20.0000 mg | Freq: Once | INTRAMUSCULAR | Status: AC
  Administered 2013-12-25: 20 mg via INTRAMUSCULAR
  Filled 2013-12-25: qty 2

## 2013-12-25 MED ORDER — PROMETHAZINE HCL 25 MG/ML IJ SOLN
12.5000 mg | Freq: Once | INTRAMUSCULAR | Status: AC
  Administered 2013-12-25: 12.5 mg via INTRAVENOUS
  Filled 2013-12-25: qty 1

## 2013-12-25 MED ORDER — SODIUM CHLORIDE 0.9 % IV SOLN
INTRAVENOUS | Status: DC
Start: 2013-12-25 — End: 2013-12-25
  Administered 2013-12-25: 15:00:00 via INTRAVENOUS

## 2013-12-25 MED ORDER — PROMETHAZINE HCL 25 MG/ML IJ SOLN
INTRAMUSCULAR | Status: AC
  Filled 2013-12-25: qty 1

## 2013-12-25 NOTE — ED Notes (Signed)
Began vomiting around MN last night and diarrhea at 0400.  Has dizziness but relates this to Meniere's dz.  Mid abdominal pain, cramping.  Does not recall change in dietary intake over last day.

## 2013-12-25 NOTE — Discharge Instructions (Signed)
°Emergency Department Resource Guide °1) Find a Doctor and Pay Out of Pocket °Although you won't have to find out who is covered by your insurance plan, it is a good idea to ask around and get recommendations. You will then need to call the office and see if the doctor you have chosen will accept you as a new patient and what types of options they offer for patients who are self-pay. Some doctors offer discounts or will set up payment plans for their patients who do not have insurance, but you will need to ask so you aren't surprised when you get to your appointment. ° °2) Contact Your Local Health Department °Not all health departments have doctors that can see patients for sick visits, but many do, so it is worth a call to see if yours does. If you don't know where your local health department is, you can check in your phone book. The CDC also has a tool to help you locate your state's health department, and many state websites also have listings of all of their local health departments. ° °3) Find a Walk-in Clinic °If your illness is not likely to be very severe or complicated, you may want to try a walk in clinic. These are popping up all over the country in pharmacies, drugstores, and shopping centers. They're usually staffed by nurse practitioners or physician assistants that have been trained to treat common illnesses and complaints. They're usually fairly quick and inexpensive. However, if you have serious medical issues or chronic medical problems, these are probably not your best option. ° °No Primary Care Doctor: °- Call [facility] Connect at  832-8000 - they can help you locate a primary care doctor that  accepts your insurance, provides certain services, etc. °- Physician Referral Service- 1-800-533-3463 ° °Chronic Pain Problems: °Organization         Address  Phone   Notes  °[facility]  (336) 297-2271 Patients need to be referred by their primary care doctor.  ° °Medication  Assistance: °Organization         Address  Phone   Notes  °[place] [facility] [address] °[place], [place] [address] (336) 641-8030 --Must be a resident of [place] °-- Must have NO insurance coverage whatsoever (no Medicaid/ Medicare, etc.) °-- The pt. MUST have a primary care doctor that directs their care regularly and follows them in the community °  °[facility]  (866) 331-1348   °[employer]  (888) 892-1162   ° °Agencies that provide inexpensive medical care: °Organization         Address  Phone   Notes  °[facility]  (336) 832-8035   °[facility]    (336) 832-7272   °[facility] [address] °[place], [place] [address] (336) 832-4777   °[facility] [address], °[place] (336) 271-4999   °[facility]    (336) 373-0678   °[facility]    (336) 272-1050   °[facility] ° [address], [place] Phone:  (336) 832-4444, Fax:  (336) 832-4440 Hours of Operation:  9 am - 6 pm, M-F.  Also accepts Medicaid/Medicare and self-pay.  °[facility] ° [address], [place] Phone: (336) [phone], Fax: (336) [phone]. Hours of Operation:  8:30 am - 5:30 pm, M-F.  Also accepts Medicaid and self-pay.  °[facility] [place] [address], [place] Phone: (336) 878-6027   °[facility] [address], [place], [place] (336)723-1848, Ext. 123 Mondays & Thursdays: 7-9 AM.  First 15 patients are seen on a first come, first serve basis. °  ° °Medicaid-accepting [place] Providers: ° °Organization         Address  Phone   Notes  °[facility] [address], [place] (336) 641-2100 Also accepts self-pay patients.  °[facility] [address], [place] ° (336) 856-9996   °[facility] [address], [place]  (336) 288-8857   °[facility] [address] [place] [address], [place] (336) 299-7000   °Veita Bland [address], [place]  ° (336) 373-1557 Only accepts [place] Access Medicaid patients after they have their name applied to their card.  ° °Self-Pay (no insurance) in [place]: ° °Organization         Address  Phone   Notes  °Sickle Cell Patients, [facility] [address], [place] (336) 832-1970   °[facility] [address], [place] (336) 832-4400   °[facility] ° [address], [place] (336) 992-4800   °[facility]/Dr. Osei-Bonsu ° [address] [place] [address], [place] or [address], [place] (336) 841-8500 Phone number for both [place] and [place] locations is the same.  °[facility] [address], [place] (336) 299-0000   °[facility] [address] [place] [address], [place] or [address] (336) 852-7530 °(336) 878-2260   °[facility] [address], [place] (336) 350-1642, phone; (336) 294-5005, fax Sees patients 1st and 3rd Saturday of every month.  Must not qualify for public or private insurance (i.e. Medicaid, Medicare, [place] Health Choice, Veterans' Benefits) • Household income should be no more than 200% of the poverty level •The clinic cannot treat you if you are pregnant or think you are pregnant • Sexually transmitted diseases are not treated at the clinic.  ° ° °Dental Care: °Organization         Address  Phone  Notes  °[place] [facility] [address], [place] (336) 641-6152 Accepts children up to age 21 who are enrolled in Medicaid or [place] Health Choice; pregnant women with a Medicaid card; and children who have applied for Medicaid or [place] Health Choice, but were declined, whose parents can pay a reduced fee at time of service.  °[place]  [facility] [place]  [address], [place] (336) 641-7733 Accepts children up to age 21 who are enrolled in Medicaid or [place] Health Choice; pregnant women with a Medicaid card; and children who have applied for Medicaid or [place] Health Choice, but were declined, whose parents can pay a reduced fee at time of service.  °[facility] ° [address], [place] (336) 641-4533 Patients are seen by appointment only. Walk-ins are not accepted. [facility] will see patients 18 years of age and older. °Monday - Tuesday (8am-5pm) °Most Wednesdays (8:30-5pm) °$30 per visit, cash only  °[facility] ° [address], [place] (336) 641-4533 Patients are seen by appointment only. Walk-ins are not accepted. [facility] will see patients 18 years of age and older. °One   Wednesday Evening (Monthly: Volunteer Based).  $30 per visit, cash only  °[facility]  (919) 537-3737 for adults; Children under age 4, call Graduate Pediatric Dentistry at (919) [phone]. Children aged 4-14, please call (919) [phone] to request a pediatric application. ° Dental services are provided in all areas of dental care including fillings, crowns and bridges, complete and partial dentures, implants, gum treatment, root canals, and extractions. Preventive care is also provided. Treatment is provided to both adults and children. °Patients are selected via a lottery and there is often a waiting list. °  °[facility] [address], °[place] ° (336) 763-8833 [email or web address] °  °[facility] [address], [place], [place] (336)723-1848, Ext. 123 Second and Fourth Thursday of each month, opens at 6:30 AM; Clinic ends at 9 AM.  Patients are seen on a first-come first-served basis, and a limited number are seen during each clinic.  ° °[facility] ° [address], [place], [place] (336) 723-7904    Eligibility Requirements °You must have lived in [place], [place], or [place] counties for at least the last three months. °  You cannot be eligible for state or federal sponsored healthcare insurance, including Veterans Administration, Medicaid, or Medicare. °  You generally cannot be eligible for healthcare insurance through your employer.  °  How to apply: °Eligibility screenings are held every Tuesday and Wednesday afternoon from 1:00 pm until 4:00 pm. You do not need an appointment for the interview!  °[facility] [address], [place], [place] 336-631-2330   °[facility]  336-342-8273   °[facility]  336-703-3100   °[facility]  336-570-6415   ° °Behavioral Health Resources in the Community: °Intensive Outpatient Programs °Organization         Address  Phone  Notes  °[facility] [address], [place], [place] 336-878-6098   °[facility] [address], [place], [place] 336-832-9800   °[facility] [address], [place], [place] ° 336-882-2125   °[facility] [address],  °[place], [place] 1-800-853-5163 or 336-641-4981   °Substance Abuse Resources °Organization         Address  Phone  Notes  °[facility]  336-882-2125   °[facility]  336-784-9470   °[facility]  336-285-9073   °[facility]  336-845-3988   °Residential & Outpatient Substance Abuse Program  1-800-659-3381   °Psychological Services °Organization         Address  Phone  Notes  °[facility]  336- 832-9600   °[facility]  336- 378-7881   °[facility] [address], [place] 1-800-853-5163 or 336-641-4981   ° °Mobile Crisis Teams °Organization         Address  Phone  Notes  °[facility], Mobile Crisis Care Unit  1-877-626-1772   °Assertive °[facility] ° [address]  [place], [place] 336-834-9664   °Sharon DeEsch [address] °[place] 336-554-5454   ° °Self-Help/Support Groups °Organization         Address  Phone             Notes  °[facility]  - variety of support groups  336- 373-1402 Call for more information  °Narcotics Anonymous (NA), [facility] [address], °[place]  2 meetings at this location  ° °  Residential Treatment Programs Organization         Address  Phone  Notes  [facility] [address],    [place]  [phone])[phone]   [facility]  [address], [place] [phone], [place], [place] [phone]   [facility] [address], [place] [phone] Admissions: 8am-3pm M-F  [facility] [address]    [place], [place] [phone]   [facility] [address], [place], [place] [phone]   [facility] [address]  [place], [place] [phone]   [facility] Intensive Outpatient [address], [place], [place] [phone])[phone]   [facility] [address]  [place], [place] [phone] or [phone]   [facility] [address] [place], [place] [phone] Accepts Medicaid  [facility] [address]  [place] [phone] Substance Abuse/Addiction Treatment   [facility]         Address  Phone  Notes  [facility]  [phone](888) [phone]   Angie FavaJulie Brannon, PhD [address], [address] [place], [place]   (304) [phone](336) [phone] or [phone](336) [phone]   [facility]   [address] [place], [place] [phone](336) ([phone]) [phone]   [facility] [address], [place], [place] [phone](336) [phone] Insurance/Medicaid/sponsorship through [facility] [address]                                    [place], [place] [phone](336) [phone] Therapy/tele-psych/case    [facility] [address]   [place], [place] [phone](336) [phone]    Dr. Lolly MustacheArfeen  [phone](336) [phone]   [facility]  [employer] [facility] 1) [address], [place] 2) [address], [place] 3)  [address], [place] [phone](336) [phone](336) [phone]  [phone](336) [phone]   [facility] Child Abuse Hotline [phone](336) 346-638-9170 or [phone](336) [phone] (After Hours)       Take the prescription as directed.  Increase your fluid intake (ie:  Gatoraide) for the next few days.  Eat a bland diet and advance to your regular diet slowly as you can tolerate it.   Avoid full strength juices, as well as milk and milk products until your diarrhea has resolved.   Call your regular medical doctor Monday to schedule a follow up appointment in the next 3 days.  Return to the Emergency Department immediately if not improving (or even worsening) despite taking the medicines as prescribed, any black or bloody stool or vomit, if you develop a fever over "101," or for any other concerns.

## 2013-12-25 NOTE — ED Provider Notes (Signed)
CSN: [id number]     Arrival date & time 12/25/13  1251 History   First MD Initiated Contact with Patient 12/25/13 1313     Chief Complaint  Patient presents with  . Emesis  . Diarrhea      HPI Pt was seen at 1325. Per pt, c/o gradual onset and persistence of multiple intermittent episodes of N/V/D that began overnight last night.  Describes the stools as "watery."  Last emesis approximately 0100, last stool PTA. Has been associated with generalized abd "cramping." Pt also feels his "dizziness is acting up" since he "started drinking a lot of Gatorade this morning because I was vomiting." Denies CP/SOB, no back pain, no fevers, no black or blood in stools or emesis.     Past Medical History  Diagnosis Date  . Meniere disease   . Dizziness   . Depression   . Anxiety   . History of nephrolithiasis   . Complication of anesthesia 2005    does not recall name of meds. but makes his ears ring  . Shortness of breath   . GERD (gastroesophageal reflux disease)    Past Surgical History  Procedure Laterality Date  . Esophagogastroduodenoscopy  04/15/07    patchy erythema in antrum, negative H.pylori   . External ear surgery      right  . Hemorroidectomy    . Bronchoscopy  08/23/06    erythema was found in the left lower lobe  . Anal fissure repair  Sept 2001    Dr. Theodis SatoAmjad Bhatti, [facility]  . 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 Theodis SatoAmjad Bhatti, [facility], noted suprasphincteric fistula, with seton placement   . Anal fissure repair  11/14/2011    Procedure: ANAL FISSURE REPAIR;  Surgeon: Dalia HeadingMark A Jenkins, MD;  Location: [facility];  Service: General;  Laterality: N/A;  Excision Skin Tag of Anus   Family History  Problem Relation Age of Onset  . Colon cancer Neg Hx   . Diabetes     History  Substance Use Topics  . Smoking status: Former Smoker    Types: Cigarettes  . Smokeless tobacco: Current User    Types: Snuff  . Alcohol Use: No     Review of Systems ROS: Statement: All systems negative except as marked or noted in the HPI; Constitutional: Negative for fever and chills. ; ; Eyes: Negative for eye pain, redness and discharge. ; ; ENMT: Negative for ear pain, hoarseness, nasal congestion, sinus pressure and sore throat. ; ; Cardiovascular: Negative for chest pain, palpitations, diaphoresis, dyspnea and peripheral edema. ; ; Respiratory: Negative for cough, wheezing and stridor. ; ; Gastrointestinal: +abd pain, N/V/D. Negative for blood in stool, hematemesis, jaundice and rectal bleeding. . ; ; Genitourinary: Negative for dysuria, flank pain and hematuria. ; ; Musculoskeletal: Negative for back pain and neck pain. Negative for swelling and trauma.; ; Skin: Negative for pruritus, rash, abrasions, blisters, bruising and skin lesion.; ; Neuro: +vertigo. Negative for headache, lightheadedness and neck stiffness. Negative for weakness, altered level of consciousness , altered mental status, extremity weakness, paresthesias, involuntary movement, seizure and syncope.     Allergies  Percocet and Vicodin  Home Medications   Prior to Admission medications   Medication Sig Start Date End Date Taking? Authorizing Provider  ALPRAZolam Prudy Feeler(XANAX) 0.5 MG tablet Take 0.5-1 mg by mouth daily.   Yes Historical Provider, MD  bismuth subsalicylate (PEPTO BISMOL) 262 MG chewable tablet Chew 524 mg  by mouth as needed for diarrhea or loose stools.    Yes Historical Provider, MD  traMADol (ULTRAM) 50 MG tablet Take 1-2 tablets (50-100 mg total) by mouth every 8 (eight) hours as needed for pain. 05/17/12  Yes John-Adam Bonk, MD   BP 125/69  Pulse 100  Temp(Src) 97.6 F (36.4 C) (Oral)  Resp 18  Ht 5\' 6"  (1.676 m)  Wt 220 lb (99.791 kg)  BMI 35.53 kg/m2  SpO2 98% Physical Exam 1330: Physical examination:  Nursing notes reviewed; Vital signs and O2 SAT reviewed;  Constitutional: Well developed, Well nourished, Well hydrated, In no acute  distress; Head:  Normocephalic, atraumatic; Eyes: EOMI, PERRL, No scleral icterus; ENMT: Mouth and pharynx normal, Mucous membranes moist; Neck: Supple, Full range of motion, No lymphadenopathy; Cardiovascular: Regular rate and rhythm, No murmur, rub, or gallop; Respiratory: Breath sounds clear & equal bilaterally, No rales, rhonchi, wheezes.  Speaking full sentences with ease, Normal respiratory effort/excursion; Chest: Nontender, Movement normal; Abdomen: Soft, +mild diffuse tenderness to palp. No rebound or guarding. Nondistended, Normal bowel sounds; Genitourinary: No CVA tenderness; Extremities: Pulses normal, No tenderness, No edema, No calf edema or asymmetry.; Neuro: AA&Ox3, Major CN grossly intact.  Speech clear. No gross focal motor or sensory deficits in extremities.; Skin: Color normal, Warm, Dry.   ED Course  Procedures   MDM  MDM Reviewed: previous chart, nursing note and vitals Reviewed previous: labs Interpretation: labs and x-ray     Results for orders placed during the hospital encounter of 12/25/13  CBC WITH DIFFERENTIAL      Result Value Ref Range   WBC 13.3 (*) 4.0 - 10.5 K/uL   RBC 5.96 (*) 4.22 - 5.81 MIL/uL   Hemoglobin 17.4 (*) 13.0 - 17.0 g/dL   HCT 16.150.0  09.639.0 - 04.552.0 %   MCV 83.9  78.0 - 100.0 fL   MCH 29.2  26.0 - 34.0 pg   MCHC 34.8  30.0 - 36.0 g/dL   RDW 40.913.5  81.111.5 - 91.415.5 %   Platelets 205  150 - 400 K/uL   Neutrophils Relative % 91 (*) 43 - 77 %   Neutro Abs 12.0 (*) 1.7 - 7.7 K/uL   Lymphocytes Relative 5 (*) 12 - 46 %   Lymphs Abs 0.7  0.7 - 4.0 K/uL   Monocytes Relative 4  3 - 12 %   Monocytes Absolute 0.6  0.1 - 1.0 K/uL   Eosinophils Relative 0  0 - 5 %   Eosinophils Absolute 0.0  0.0 - 0.7 K/uL   Basophils Relative 0  0 - 1 %   Basophils Absolute 0.0  0.0 - 0.1 K/uL  COMPREHENSIVE METABOLIC PANEL      Result Value Ref Range   Sodium 142  137 - 147 mEq/L   Potassium 4.5  3.7 - 5.3 mEq/L   Chloride 104  96 - 112 mEq/L   CO2 26  19 - 32  mEq/L   Glucose, Bld 118 (*) 70 - 99 mg/dL   BUN 14  6 - 23 mg/dL   Creatinine, Ser 7.821.09  0.50 - 1.35 mg/dL   Calcium 9.3  8.4 - 95.610.5 mg/dL   Total Protein 7.7  6.0 - 8.3 g/dL   Albumin 4.1  3.5 - 5.2 g/dL   AST 20  0 - 37 U/L   ALT 30  0 - 53 U/L   Alkaline Phosphatase 80  39 - 117 U/L   Total Bilirubin 0.6  0.3 - 1.2  mg/dL   GFR calc non Af Amer 82 (*) >90 mL/min   GFR calc Af Amer >90  >90 mL/min   Anion gap 12  5 - 15  LIPASE, BLOOD      Result Value Ref Range   Lipase 40  11 - 59 U/L   Dg Abd Acute W/chest 12/25/2013   CLINICAL DATA:  Emesis.  Diarrhea.  EXAM: ACUTE ABDOMEN SERIES (ABDOMEN 2 VIEW & CHEST 1 VIEW)  COMPARISON:  Chest radiography 08/07/2012. Abdominal radiography 05/17/2012  FINDINGS: Air-fluid levels are present in nondilated colon, consistent with the clinical history of diarrhea. No sign of ileus, obstruction or free air. Radiodense ingested material is present in the stomach. No bony abnormality.  One-view chest: Heart size is normal. Mediastinal shadows are normal. The lungs are clear. No effusions. No free air.  IMPRESSION: Small air-fluid levels in the colon consistent with the clinical history of diarrhea. No sign of ileus, obstruction or free air.   Electronically Signed   By: Paulina Fusi M.D.   On: 12/25/2013 15:10    1730:  Pt has slept most of his ED visit. Pt has tol PO well while in the ED without N/V.  No stooling while in the ED.  Abd remains benign, VSS. Feels better and wants to go home now. Dx and testing d/w pt and family.  Questions answered.  Verb understanding, agreeable to d/c home with outpt f/u.    Laray Anger, DO 12/28/13 [id number]

## 2014-01-09 ENCOUNTER — Emergency Department (HOSPITAL_COMMUNITY): Payer: Medicare Other

## 2014-01-09 ENCOUNTER — Encounter (HOSPITAL_COMMUNITY): Payer: Self-pay | Admitting: Emergency Medicine

## 2014-01-09 ENCOUNTER — Emergency Department (HOSPITAL_COMMUNITY)
Admission: EM | Admit: 2014-01-09 | Discharge: 2014-01-09 | Disposition: A | Discharge status: Home / Self Care | Payer: Medicare Other | Attending: Emergency Medicine | Admitting: Emergency Medicine

## 2014-01-09 DIAGNOSIS — Y9389 Activity, other specified: Secondary | ICD-10-CM | POA: Insufficient documentation

## 2014-01-09 DIAGNOSIS — Z79899 Other long term (current) drug therapy: Secondary | ICD-10-CM | POA: Diagnosis not present

## 2014-01-09 DIAGNOSIS — Y929 Unspecified place or not applicable: Secondary | ICD-10-CM | POA: Diagnosis not present

## 2014-01-09 DIAGNOSIS — F3289 Other specified depressive episodes: Secondary | ICD-10-CM | POA: Insufficient documentation

## 2014-01-09 DIAGNOSIS — Z8669 Personal history of other diseases of the nervous system and sense organs: Secondary | ICD-10-CM | POA: Diagnosis not present

## 2014-01-09 DIAGNOSIS — S61411A Laceration without foreign body of right hand, initial encounter: Secondary | ICD-10-CM

## 2014-01-09 DIAGNOSIS — F411 Generalized anxiety disorder: Secondary | ICD-10-CM | POA: Insufficient documentation

## 2014-01-09 DIAGNOSIS — Z87442 Personal history of urinary calculi: Secondary | ICD-10-CM | POA: Diagnosis not present

## 2014-01-09 DIAGNOSIS — Z87891 Personal history of nicotine dependence: Secondary | ICD-10-CM | POA: Insufficient documentation

## 2014-01-09 DIAGNOSIS — W268XXA Contact with other sharp object(s), not elsewhere classified, initial encounter: Secondary | ICD-10-CM | POA: Diagnosis not present

## 2014-01-09 DIAGNOSIS — S61409A Unspecified open wound of unspecified hand, initial encounter: Secondary | ICD-10-CM | POA: Diagnosis not present

## 2014-01-09 DIAGNOSIS — F329 Major depressive disorder, single episode, unspecified: Secondary | ICD-10-CM | POA: Insufficient documentation

## 2014-01-09 DIAGNOSIS — Z8719 Personal history of other diseases of the digestive system: Secondary | ICD-10-CM | POA: Insufficient documentation

## 2014-01-09 DIAGNOSIS — R209 Unspecified disturbances of skin sensation: Secondary | ICD-10-CM | POA: Diagnosis not present

## 2014-01-09 MED ORDER — LIDOCAINE HCL (PF) 1 % IJ SOLN
INTRAMUSCULAR | Status: AC
  Filled 2014-01-09: qty 5

## 2014-01-09 MED ORDER — CEPHALEXIN 500 MG PO CAPS: 500.0000 mg | ORAL_CAPSULE | Freq: Four times a day (QID) | ORAL | Status: DC

## 2014-01-09 MED ORDER — TRAMADOL HCL 50 MG PO TABS: 50.0000 mg | ORAL_TABLET | Freq: Four times a day (QID) | ORAL | Status: DC | PRN

## 2014-01-09 NOTE — ED Provider Notes (Signed)
Medical screening examination/treatment/procedure(s) were conducted as a shared visit with non-physician practitioner(s) and myself.  I personally evaluated the patient during the encounter.   EKG Interpretation None     Recent blood ER for evaluation of laceration to right hand. Patient had a contused and macerated wound that required repair. Repair performed by Burgess AmorJulie Idol, PA-C.  Gilda Creasehristopher J. Janasha Barkalow, MD 01/09/14 [id number]

## 2014-01-09 NOTE — ED Notes (Signed)
Patient with no complaints at this time. Respirations even and unlabored. Skin warm/dry. Discharge instructions reviewed with patient at this time. Patient given opportunity to voice concerns/ask questions. Patient discharged at this time and left Emergency Department with steady gait.   

## 2014-01-09 NOTE — ED Notes (Signed)
Pt states he cut his hand with a router just PTA. States tetanus is UTD. Laceration between thumb and index finger of right hand.

## 2014-01-09 NOTE — ED Provider Notes (Signed)
CSN: [id number]     Arrival date & time 01/09/14  1648 History   First MD Initiated Contact with Patient 01/09/14 1650     Chief Complaint  Patient presents with  . Extremity Laceration     (Consider location/radiation/quality/duration/timing/severity/associated sxs/prior Treatment) The history is provided by the patient.    Edwin Norton is a 43 y.o. male presenting with laceration to his right hand which occurred just prior to arrival.  He was helping a friend hold a piece of wood when it slipped and he was cut by the router.  He reports constant pain which is worsened with palpation.  He has applied pressure and has obtained hemostasis.  He is utd with his tetanus.     Past Medical History  Diagnosis Date  . Meniere disease   . Dizziness   . Depression   . Anxiety   . History of nephrolithiasis   . Complication of anesthesia 2005    does not recall name of meds. but makes his ears ring  . Shortness of breath   . GERD (gastroesophageal reflux disease)    Past Surgical History  Procedure Laterality Date  . Esophagogastroduodenoscopy  04/15/07    patchy erythema in antrum, negative H.pylori   . External ear surgery      right  . Hemorroidectomy    . Bronchoscopy  08/23/06    erythema was found in the left lower lobe  . Anal fissure repair  Sept 2001    Dr. Theodis SatoAmjad Bhatti, [facility]  . 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 Theodis [facility]jad Bhatti, [facility], noted suprasphincteric fistula, with seton placement   . Anal fissure repair  11/14/2011    Procedure: ANAL FISSURE REPAIR;  Surgeon: Dalia HeadingMark A Jenkins, MD;  Location: [facility];  Service: General;  Laterality: N/A;  Excision Skin Tag of Anus   Family History  Problem Relation Age of Onset  . Colon cancer Neg Hx   . Diabetes     History  Substance Use Topics  . Smoking status: Former Smoker    Types: Cigarettes  . Smokeless tobacco: Current User    Types: Snuff  . Alcohol  Use: No    Review of Systems  Constitutional: Negative for fever and chills.  Respiratory: Negative for shortness of breath and wheezing.   Skin: Positive for wound.  Neurological: Positive for numbness. Negative for weakness.       Reports tingling sensation lateral index finger.      Allergies  Percocet and Vicodin  Home Medications   Prior to Admission medications   Medication Sig Start Date End Date Taking? Authorizing Provider  ALPRAZolam Prudy Feeler(XANAX) 0.5 MG tablet Take 0.5-1 mg by mouth daily.   Yes Historical Provider, MD  promethazine (PHENERGAN) 25 MG tablet Take 1 tablet (25 mg total) by mouth every 6 (six) hours as needed for nausea or vomiting (or "dizziness"). 12/25/13  Yes Laray AngerKathleen M McManus, DO  cephALEXin (KEFLEX) 500 MG capsule Take 1 capsule (500 mg total) by mouth 4 (four) times daily. 01/09/14   Burgess AmorJulie Jaeleah Smyser, PA-C  traMADol (ULTRAM) 50 MG tablet Take 1 tablet (50 mg total) by mouth every 6 (six) hours as needed for moderate pain. 01/09/14   Burgess AmorJulie Ladavion Savitz, PA-C   BP 131/81  Pulse 94  Temp(Src) 98.9 F (37.2 C) (Oral)  Resp 18  Ht 5\' 6"  (1.676 m)  Wt 210 lb (95.255 kg)  BMI 33.91 kg/m2  SpO2  99% Physical Exam  Constitutional: He is oriented to person, place, and time. He appears well-developed and well-nourished.  HENT:  Head: Normocephalic.  Cardiovascular: Normal rate.   Pulmonary/Chest: Effort normal.  Musculoskeletal: Normal range of motion. He exhibits no tenderness.  Neurological: He is alert and oriented to person, place, and time. No sensory deficit.  Skin: Laceration noted.  2 cm irregular laceration right volar hand of thenar eminence and into the web space between the thumb and index finger.  He has sensation in all fingers,  But reduced in the lateral index finger.  Less than 2 sec distal cap refill in fingers.  Patient can flex/ext resisted all fingers without deficit.      ED Course  Procedures (including critical care time)   LACERATION  REPAIR Performed by: Burgess Amor Authorized by: Burgess Amor Consent: Verbal consent obtained. Risks and benefits: risks, benefits and alternatives were discussed Consent given by: patient Patient identity confirmed: provided demographic data Prepped and Draped in normal sterile fashion Wound explored.  No fb.  No tendon involvement.  Laceration Location: right hand  Laceration Length: 2cm  No Foreign Bodies seen or palpated  Anesthesia: local infiltration  Local anesthetic: lidocaine 1% without epinephrine  Anesthetic total: 4 ml  Irrigation method: syringe Amount of cleaning: copious flushing with NS after cleaning skin with Safe clens.  Pt's wound was revised, removing 2 small nonviable flaps prior to sutures Skin closure: #6 ethilon 4-0  Number of sutures: 6  Technique: simple interrupted  Patient tolerance: Patient tolerated the procedure well with no immediate complications.  Labs Review Labs Reviewed - No data to display  Imaging Review Dg Hand Complete Right  01/09/2014   CLINICAL DATA:  Pain, laceration on palmar surface of hand between the thumb and index finger  EXAM: RIGHT HAND - COMPLETE 3+ VIEW  COMPARISON:  None.  FINDINGS: There is no evidence of fracture or dislocation. There is no evidence of arthropathy or other focal bone abnormality. Soft tissue laceration between the thumb and index finger.  IMPRESSION: No acute osseous injury of the right hand.   Electronically Signed   By: Elige Ko   On: 01/09/2014 17:17     EKG Interpretation None      MDM   Final diagnoses:  Laceration of hand with complication, right, initial encounter    Wound care instructions given.  Pt advised to have sutures removed in 10 days,  Return here sooner for any signs of infection including redness, swelling, worse pain or drainage of pus.      Burgess Amor, PA-C 01/09/14 [phone]

## 2014-01-09 NOTE — Discharge Instructions (Signed)

## 2014-03-10 ENCOUNTER — Emergency Department (HOSPITAL_COMMUNITY)
Admission: EM | Admit: 2014-03-10 | Discharge: 2014-03-10 | Disposition: A | Discharge status: Home / Self Care | Payer: Medicare Other | Attending: Emergency Medicine | Admitting: Emergency Medicine

## 2014-03-10 ENCOUNTER — Encounter (HOSPITAL_COMMUNITY): Payer: Self-pay | Admitting: Emergency Medicine

## 2014-03-10 ENCOUNTER — Emergency Department (HOSPITAL_COMMUNITY): Payer: Medicare Other

## 2014-03-10 DIAGNOSIS — Z87442 Personal history of urinary calculi: Secondary | ICD-10-CM | POA: Diagnosis not present

## 2014-03-10 DIAGNOSIS — F3289 Other specified depressive episodes: Secondary | ICD-10-CM | POA: Diagnosis not present

## 2014-03-10 DIAGNOSIS — F411 Generalized anxiety disorder: Secondary | ICD-10-CM | POA: Diagnosis not present

## 2014-03-10 DIAGNOSIS — Z8719 Personal history of other diseases of the digestive system: Secondary | ICD-10-CM | POA: Insufficient documentation

## 2014-03-10 DIAGNOSIS — F329 Major depressive disorder, single episode, unspecified: Secondary | ICD-10-CM | POA: Insufficient documentation

## 2014-03-10 DIAGNOSIS — H8109 Meniere's disease, unspecified ear: Secondary | ICD-10-CM | POA: Diagnosis not present

## 2014-03-10 DIAGNOSIS — J0110 Acute frontal sinusitis, unspecified: Secondary | ICD-10-CM

## 2014-03-10 DIAGNOSIS — J3489 Other specified disorders of nose and nasal sinuses: Secondary | ICD-10-CM | POA: Insufficient documentation

## 2014-03-10 DIAGNOSIS — Z79899 Other long term (current) drug therapy: Secondary | ICD-10-CM | POA: Diagnosis not present

## 2014-03-10 DIAGNOSIS — H5500 Unspecified nystagmus: Secondary | ICD-10-CM | POA: Insufficient documentation

## 2014-03-10 DIAGNOSIS — Z87891 Personal history of nicotine dependence: Secondary | ICD-10-CM | POA: Diagnosis not present

## 2014-03-10 MED ORDER — LORAZEPAM 1 MG PO TABS
1.0000 mg | ORAL_TABLET | Freq: Once | ORAL | Status: AC
  Administered 2014-03-10: 1 mg via ORAL
  Filled 2014-03-10: qty 1

## 2014-03-10 MED ORDER — MECLIZINE HCL 25 MG PO TABS: 25.0000 mg | ORAL_TABLET | Freq: Three times a day (TID) | ORAL | Status: DC | PRN

## 2014-03-10 MED ORDER — MECLIZINE HCL 12.5 MG PO TABS
25.0000 mg | ORAL_TABLET | Freq: Once | ORAL | Status: AC
  Administered 2014-03-10: 25 mg via ORAL
  Filled 2014-03-10: qty 2

## 2014-03-10 NOTE — Discharge Instructions (Signed)
Your vital signs are nonacute at this time. The chest x-ray is read by the radiologist as negative. Please use Allegra-D for nasal congestion. Please use meclizine and 3 times daily as needed for assistance with your dizziness and Mnire's disease. Please continue your current medications. Meclizine may cause drowsiness, please use with caution. Please increase fluids. Please wash hands frequently. Please followup with Dr.Comstock as sone as possible for followup and continuity of care.

## 2014-03-10 NOTE — ED Provider Notes (Signed)
Medical screening examination/treatment/procedure(s) were performed by non-physician practitioner and as supervising physician I was immediately available for consultation/collaboration.   EKG Interpretation None        Layla Maw Ward, DO 03/10/14 1449

## 2014-03-10 NOTE — ED Notes (Signed)
Nasal congestion with worsening dizziness and generalized body aches x 4 days.

## 2014-03-10 NOTE — ED Provider Notes (Addendum)
CSN: [id number]     Arrival date & time 03/10/14  1914 History   First MD Initiated Contact with Patient 03/10/14 [phone]     Chief Complaint  Patient presents with  . Nasal Congestion     (Consider location/radiation/quality/duration/timing/severity/associated sxs/prior Treatment) HPI Comments: Pt presents to ED with c/o dizziness with nasal congestion.  Pt states he has hx of Meniere Disease. He is on scopolamine patches. Recently he developed nasal congestion an cold symptoms. No high fever reported. No blood in nasal mucus,nor any bloody mucus coughed up. Pt has frequent bouts with dizziness,but this symptom is worse, which brough pt to ED for additional evaluation. He has an ENT, but can not get appt at this time. No hx of injury or trauma.   The history is provided by the patient.    Past Medical History  Diagnosis Date  . Meniere disease   . Dizziness   . Depression   . Anxiety   . History of nephrolithiasis   . Complication of anesthesia 2005    does not recall name of meds. but makes his ears ring  . Shortness of breath   . GERD (gastroesophageal reflux disease)    Past Surgical History  Procedure Laterality Date  . Esophagogastroduodenoscopy  04/15/07    patchy erythema in antrum, negative H.pylori   . External ear surgery      right  . Hemorroidectomy    . Bronchoscopy  08/23/06    erythema was found in the left lower lobe  . Anal fissure repair  Sept 2001    Dr. Theodis Sato, [facility]  . 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 Theodis Sato, [facility], noted suprasphincteric fistula, with seton placement   . Anal fissure repair  11/14/2011    Procedure: ANAL FISSURE REPAIR;  Surgeon: Dalia Heading, MD;  Location: [facility];  Service: General;  Laterality: N/A;  Excision Skin Tag of Anus   Family History  Problem Relation Age of Onset  . Colon cancer Neg Hx   . Diabetes     History  Substance Use Topics  .  Smoking status: Former Smoker    Types: Cigarettes  . Smokeless tobacco: Current User    Types: Snuff  . Alcohol Use: No    Review of Systems  Constitutional: Negative for fever and activity change.       All ROS Neg except as noted in HPI  HENT: Positive for congestion. Negative for nosebleeds.   Eyes: Negative for photophobia and discharge.  Respiratory: Positive for cough and wheezing. Negative for shortness of breath.   Cardiovascular: Negative for chest pain and palpitations.  Gastrointestinal: Negative for abdominal pain and blood in stool.  Genitourinary: Negative for dysuria, frequency and hematuria.  Musculoskeletal: Negative for arthralgias, back pain and neck pain.  Skin: Negative.   Neurological: Positive for dizziness. Negative for seizures and speech difficulty.  Psychiatric/Behavioral: Negative for hallucinations and confusion.      Allergies  Percocet and Vicodin  Home Medications   Prior to Admission medications   Medication Sig Start Date End Date Taking? Authorizing Provider  ALPRAZolam Prudy Feeler) 0.5 MG tablet Take 0.5-1 mg by mouth daily.   Yes Historical Provider, MD  guaiFENesin (MUCINEX) 600 MG 12 hr tablet Take 1,200 mg by mouth 2 (two) times daily as needed for cough or to loosen phlegm.   Yes Historical Provider, MD  scopolamine (TRANSDERM-SCOP) 1 MG/3DAYS Place 1 patch onto  the skin daily as needed (dizziness).   Yes Historical Provider, MD   BP 138/81  Pulse 70  Temp(Src) 97.8 F (36.6 C) (Oral)  Resp 18  Ht  (1.676 m)  Wt 225 lb (102.059 kg)  BMI 36.33 kg/m2  SpO2 99% Physical Exam  Nursing note and vitals reviewed. Constitutional: He is oriented to person, place, and time. He appears well-developed and well-nourished.  Non-toxic appearance.  HENT:  Head: Normocephalic.  Right Ear: Tympanic membrane and external ear normal.  Left Ear: Tympanic membrane and external ear normal.  Nasal congestion  Eyes: EOM and lids are normal. Pupils  are equal, round, and reactive to light.  Minimal nystagmus  Neck: Normal range of motion. Neck supple. Carotid bruit is not present. No tracheal deviation and normal range of motion present.  Cardiovascular: Normal rate, regular rhythm, normal heart sounds, intact distal pulses and normal pulses.   Pulmonary/Chest: No stridor. No respiratory distress. He has rhonchi.  Abdominal: Soft. Bowel sounds are normal. There is no tenderness. There is no guarding.  Musculoskeletal: Normal range of motion.  Lymphadenopathy:       Head (right side): No submandibular adenopathy present.       Head (left side): No submandibular adenopathy present.    He has no cervical adenopathy.  Neurological: He is alert and oriented to person, place, and time. He has normal strength. No cranial nerve deficit or sensory deficit. Coordination normal.  Speech clear. Grip symmetrical. No gross deficits.  Skin: Skin is warm and dry.  Psychiatric: He has a normal mood and affect. His speech is normal.    ED Course  Procedures (including critical care time) Labs Review Labs Reviewed - No data to display  Imaging Review No results found.   EKG Interpretation None      MDM  Vital signs are well within normal limits. Pulse oximetry is 99% on room air. And there is no evidence of trauma to the head or face area. There is nasal congestion noted. The patient complains of a spinning sensation when there is a change of position, but states this is similar to his previous bouts with Mnire's problems. There is some rhonchi noted on pulmonary exam, most of which cleared with cough. Chest x-ray is negative for any acute event.  Patient was treated with meclizine and Ativan by mouth with some improvement in the dizziness.  The plan at this time is for the patient to continue his scopolamine patches, or add meclizine. Patient advised to increase fluids and he will use Claritin-D for assistance with his congestion. The  patient has been advised to followup with his ear nose and throat specialist as sone as possible. The patient is to return to the emergency department if any emergent changes, problems, or concerns.    Final diagnoses:  None    *I have reviewed nursing notes, vital signs, and all appropriate lab and imaging results for this patient.**    Kathie Dike, PA-C 03/10/14 [address] [place], [place] 03/22/14 [phone]

## 2014-03-10 NOTE — ED Notes (Signed)
PA at bedside.

## 2014-03-10 NOTE — ED Notes (Signed)
PT c/o nasal congestion with runny nose and dry cough x3 days. PT also c/o some increased dizziness but has chronic vertigo and is wearing a dramamine patch behind the right ear.

## 2014-03-23 NOTE — ED Provider Notes (Signed)
Medical screening examination/treatment/procedure(s) were performed by non-physician practitioner and as supervising physician I was immediately available for consultation/collaboration.   EKG Interpretation None        Kristen N Ward, DO 03/23/14 1502 

## 2016-11-29 ENCOUNTER — Emergency Department (HOSPITAL_COMMUNITY): Payer: Medicare Other

## 2016-11-29 ENCOUNTER — Encounter (HOSPITAL_COMMUNITY): Payer: Self-pay | Admitting: Emergency Medicine

## 2016-11-29 ENCOUNTER — Emergency Department (HOSPITAL_COMMUNITY)
Admission: EM | Admit: 2016-11-29 | Discharge: 2016-11-29 | Disposition: A | Discharge status: Home / Self Care | Payer: Medicare Other | Attending: Emergency Medicine | Admitting: Emergency Medicine

## 2016-11-29 DIAGNOSIS — R1032 Left lower quadrant pain: Secondary | ICD-10-CM | POA: Diagnosis present

## 2016-11-29 DIAGNOSIS — N201 Calculus of ureter: Secondary | ICD-10-CM | POA: Diagnosis not present

## 2016-11-29 DIAGNOSIS — N1330 Unspecified hydronephrosis: Secondary | ICD-10-CM

## 2016-11-29 DIAGNOSIS — N23 Unspecified renal colic: Secondary | ICD-10-CM

## 2016-11-29 DIAGNOSIS — Z87891 Personal history of nicotine dependence: Secondary | ICD-10-CM | POA: Diagnosis not present

## 2016-11-29 DIAGNOSIS — Z79899 Other long term (current) drug therapy: Secondary | ICD-10-CM | POA: Diagnosis not present

## 2016-11-29 LAB — URINALYSIS, ROUTINE W REFLEX MICROSCOPIC
BACTERIA UA: NONE SEEN
Bilirubin Urine: NEGATIVE
Glucose, UA: NEGATIVE mg/dL
Ketones, ur: NEGATIVE mg/dL
LEUKOCYTES UA: NEGATIVE
NITRITE: NEGATIVE
PH: 5 (ref 5.0–8.0)
Protein, ur: 100 mg/dL — AB
Specific Gravity, Urine: 1.017 (ref 1.005–1.030)
Squamous Epithelial / LPF: NONE SEEN

## 2016-11-29 MED ORDER — KETOROLAC TROMETHAMINE 30 MG/ML IJ SOLN
30.0000 mg | Freq: Once | INTRAMUSCULAR | Status: AC
  Administered 2016-11-29: 30 mg via INTRAVENOUS
  Filled 2016-11-29: qty 1

## 2016-11-29 MED ORDER — TAMSULOSIN HCL 0.4 MG PO CAPS
ORAL_CAPSULE | ORAL | 0 refills | Status: DC
Start: 2016-11-29 — End: 2019-04-03

## 2016-11-29 MED ORDER — SODIUM CHLORIDE 0.9 % IV SOLN
INTRAVENOUS | Status: DC
  Administered 2016-11-29: 16:00:00 via INTRAVENOUS

## 2016-11-29 MED ORDER — TRAMADOL HCL 50 MG PO TABS: 50.0000 mg | ORAL_TABLET | Freq: Four times a day (QID) | ORAL | 0 refills | Status: DC | PRN

## 2016-11-29 MED ORDER — HYDROMORPHONE HCL 1 MG/ML IJ SOLN
1.0000 mg | Freq: Once | INTRAMUSCULAR | Status: AC
  Administered 2016-11-29: 1 mg via INTRAVENOUS
  Filled 2016-11-29: qty 1

## 2016-11-29 MED ORDER — ONDANSETRON HCL 4 MG/2ML IJ SOLN
4.0000 mg | Freq: Once | INTRAMUSCULAR | Status: AC
  Administered 2016-11-29: 4 mg via INTRAVENOUS
  Filled 2016-11-29: qty 2

## 2016-11-29 NOTE — ED Provider Notes (Signed)
[facility] Provider Note   CSN: [id number] Arrival date & time: 11/29/16  1530     History   Chief Complaint Chief Complaint  Patient presents with  . Flank Pain    HPI Edwin Norton is a 46 y.o. male.  He presents for evaluation of left flank pain which started today, and feels like a "kidney stone."  He has noted dark colored urine, today.  He denies urinary urgency, frequency or dysuria.  He denies fever, chills, cough or chest pain.  Last kidney stone was several years ago.  There are no other known modifying factors.   HPI  Past Medical History:  Diagnosis Date  . Anxiety   . Complication of anesthesia 2005   does not recall name of meds. but makes his ears ring  . Depression   . Dizziness   . GERD (gastroesophageal reflux disease)   . History of nephrolithiasis   . Meniere disease   . Shortness of breath     Patient Active Problem List   Diagnosis Date Noted  . Foreign body in foot 12/13/2011  . Perianal dermatitis 10/22/2011  . GERD (gastroesophageal reflux disease) 06/02/2011  . Hemorrhoidal skin tag 06/02/2011    Past Surgical History:  Procedure Laterality Date  . ANAL FISSURE REPAIR  Sept 2001   Dr. Theodis Sato, [facility]  . ANAL FISSURE REPAIR  11/14/2011   Procedure: ANAL FISSURE REPAIR;  Surgeon: Dalia Heading, MD;  Location: [facility];  Service: General;  Laterality: N/A;  Excision Skin Tag of Anus  . BRONCHOSCOPY  08/23/06   erythema was found in the left lower lobe  . debridment of chronic granulation tissue and seton placement  Feb 2002   Dr. Theodis Sato, [facility], noted suprasphincteric fistula, with seton placement   . ESOPHAGOGASTRODUODENOSCOPY  04/15/07   patchy erythema in antrum, negative H.pylori   . EXTERNAL EAR SURGERY     right  . HEMORROIDECTOMY         Home Medications    Prior to Admission medications   Medication Sig Start Date End Date Taking? Authorizing Provider  ALPRAZolam Prudy Feeler) 1 MG  tablet Take 1 tablet by mouth 2 (two) times daily. 08/22/16  Yes [provider]  predniSONE (STERAPRED UNI-PAK 21 TAB) 10 MG (21) TBPK tablet Take 10 mg by mouth daily. Dose pak 6,5,4,3,2,1 finished 3days ago   Yes [provider]  guaiFENesin (MUCINEX) 600 MG 12 hr tablet Take 1,200 mg by mouth 2 (two) times daily as needed for cough or to loosen phlegm.    [provider]  meclizine (ANTIVERT) 25 MG tablet Take 1 tablet (25 mg total) by mouth 3 (three) times daily as needed for dizziness. Patient not taking: Reported on 11/29/2016 03/10/14   Ivery Quale, PA-C  scopolamine (TRANSDERM-SCOP) 1 MG/3DAYS Place 1 patch onto the skin daily as needed (dizziness).    [provider]  tamsulosin (FLOMAX) 0.4 MG CAPS capsule 1 q HS to aid stone passage 11/29/16   Mancel Bale, MD  traMADol (ULTRAM) 50 MG tablet Take 1 tablet (50 mg total) by mouth every 6 (six) hours as needed. 11/29/16   Mancel Bale, MD    Family History Family History  Problem Relation Age of Onset  . Diabetes Unknown   . Colon cancer Neg Hx     Social History Social History  Substance Use Topics  . Smoking status: Former Smoker    Types: Cigarettes  . Smokeless tobacco: Current User  Types: Snuff  . Alcohol use No     Allergies   Morphine and related; Percocet [oxycodone-acetaminophen]; and Vicodin [hydrocodone-acetaminophen]   Review of Systems Review of Systems  All other systems reviewed and are negative.    Physical Exam Updated Vital Signs BP 138/81   Pulse 82   Temp 97.7 F (36.5 C) (Oral)   Resp 20   Ht 5\' 6"  (1.676 m)   Wt 108.9 kg (240 lb)   SpO2 97%   BMI 38.74 kg/m   Physical Exam  Constitutional: He is oriented to person, place, and time. He appears well-developed and well-nourished.  HENT:  Head: Normocephalic and atraumatic.  Right Ear: External ear normal.  Left Ear: External ear normal.  Eyes: Conjunctivae and EOM are normal. Pupils are equal,  round, and reactive to light.  Neck: Normal range of motion and phonation normal. Neck supple.  Cardiovascular: Normal rate.   Pulmonary/Chest: Effort normal. He exhibits no bony tenderness.  Abdominal: Soft. There is no tenderness.  Musculoskeletal: Normal range of motion.  Neurological: He is alert and oriented to person, place, and time. No cranial nerve deficit or sensory deficit. He exhibits normal muscle tone. Coordination normal.  Skin: Skin is warm, dry and intact.  Psychiatric: He has a normal mood and affect. His behavior is normal. Judgment and thought content normal.  Nursing note and vitals reviewed.    ED Treatments / Results  Labs (all labs ordered are listed, but only abnormal results are displayed) Labs Reviewed  URINALYSIS, ROUTINE W REFLEX MICROSCOPIC - Abnormal; Notable for the following:       Result Value   Color, Urine AMBER (*)    APPearance CLOUDY (*)    Hgb urine dipstick LARGE (*)    Protein, ur 100 (*)    All other components within normal limits    EKG  EKG Interpretation None       Radiology Ct Renal Stone Study  Result Date: 11/29/2016 CLINICAL DATA:  Left flank pain EXAM: CT ABDOMEN AND PELVIS WITHOUT CONTRAST TECHNIQUE: Multidetector CT imaging of the abdomen and pelvis was performed following the standard protocol without IV contrast. COMPARISON:  12/25/2013, 05/07/2009 FINDINGS: Lower chest: Lung bases are clear. The heart size is upper limits of normal. Hepatobiliary: Hepatic steatosis. Focal hyperdense areas near the gallbladder fossa are felt consistent with focal fatty sparing. No calcified gallstones. No biliary dilatation. Pancreas: Unremarkable. No pancreatic ductal dilatation or surrounding inflammatory changes. Spleen: Normal in size without focal abnormality. Adrenals/Urinary Tract: Adrenal glands are within normal limits. Mild left hydronephrosis and proximal hydroureter, secondary to a 3 mm stone in the mid left ureter. No right  hydronephrosis. The bladder is otherwise normal Stomach/Bowel: Stomach is within normal limits. Appendix appears normal. No evidence of bowel wall thickening, distention, or inflammatory changes. Vascular/Lymphatic: Aortic atherosclerosis. No enlarged abdominal or pelvic lymph nodes. Reproductive: Prostate is unremarkable. Other: No free air or free fluid. Musculoskeletal: No acute or significant osseous findings. IMPRESSION: 1. Mild left hydronephrosis and hydroureter, secondary to a 3 mm stone in the mid left ureter. 2. Hepatic steatosis with probable focal fatty sparing near the gallbladder fossa. Electronically Signed   By: Jasmine PangKim  Fujinaga M.D.   On: 11/29/2016 16:53    Procedures Procedures (including critical care time)  Medications Ordered in ED Medications  0.9 %  sodium chloride infusion ( Intravenous New Bag/Given 11/29/16 1628)  HYDROmorphone (DILAUDID) injection 1 mg (1 mg Intravenous Given 11/29/16 1629)  ondansetron (ZOFRAN) injection 4 mg (4  mg Intravenous Given 11/29/16 1628)  ketorolac (TORADOL) 30 MG/ML injection 30 mg (30 mg Intravenous Given 11/29/16 1722)     Initial Impression / Assessment and Plan / ED Course  I have reviewed the triage vital signs and the nursing notes.  Pertinent labs & imaging results that were available during my care of the patient were reviewed by me and considered in my medical decision making (see chart for details).      Patient Vitals for the past 24 hrs:  BP Temp Temp src Pulse Resp SpO2 Height Weight  11/29/16 1631 138/81 - - 82 - 97 % - -  11/29/16 1542 (!) 153/94 97.7 F (36.5 C) Oral 81 20 98 % 5\' 6"  (1.676 m) 108.9 kg (240 lb)    5:25 PM Reevaluation with update and discussion. After initial assessment and treatment, an updated evaluation reveals pain is controlled at this time.  Findings discussed with the patient and all questions were answered. Motty Borin L    Final Clinical Impressions(s) / ED Diagnoses   Final diagnoses:    Ureteral stone  Renal colic on left side  Hydronephrosis, left   Left ureteral stone, midway to bladder, from kidney, with mild hydro-.  Patient's symptoms improved with treatment, and he is stable for discharge.  Doubt UTI.  Nursing Notes Reviewed/ Care Coordinated Applicable Imaging Reviewed Interpretation of Laboratory Data incorporated into ED treatment  The patient appears reasonably screened and/or stabilized for discharge and I doubt any other medical condition or other [facility] requiring further screening, evaluation, or treatment in the ED at this time prior to discharge.  Plan: Home Medications-ibuprofen 3 times daily, continue usual medication; Home Treatments-strain urine; return here if the recommended treatment, does not improve the symptoms; Recommended follow up-urology follow-up 1 week and as needed   New Prescriptions New Prescriptions   TAMSULOSIN (FLOMAX) 0.4 MG CAPS CAPSULE    1 q HS to aid stone passage   TRAMADOL (ULTRAM) 50 MG TABLET    Take 1 tablet (50 mg total) by mouth every 6 (six) hours as needed.     Mancel Bale, MD 11/29/16 1728

## 2016-11-29 NOTE — ED Triage Notes (Signed)
Patient complaining of left flank pain since this morning. States he has history of kidney stones.

## 2016-11-29 NOTE — Discharge Instructions (Signed)
Strain your urine, to catch the stone.  The stone to the urologist when you see him.  For pain, take ibuprofen 400 mg 3 times a day.  Use the stronger pain medicine, if needed.  Do not drive within 6 hours of taking the pain medicine.  Return here, if needed, for problems.

## 2016-12-02 ENCOUNTER — Emergency Department (HOSPITAL_COMMUNITY)
Admission: EM | Admit: 2016-12-02 | Discharge: 2016-12-02 | Disposition: A | Discharge status: Home / Self Care | Payer: Medicare Other | Attending: Emergency Medicine | Admitting: Emergency Medicine

## 2016-12-02 ENCOUNTER — Encounter (HOSPITAL_COMMUNITY): Payer: Self-pay | Admitting: *Deleted

## 2016-12-02 DIAGNOSIS — N23 Unspecified renal colic: Secondary | ICD-10-CM | POA: Insufficient documentation

## 2016-12-02 DIAGNOSIS — R1032 Left lower quadrant pain: Secondary | ICD-10-CM | POA: Diagnosis present

## 2016-12-02 DIAGNOSIS — F17290 Nicotine dependence, other tobacco product, uncomplicated: Secondary | ICD-10-CM | POA: Insufficient documentation

## 2016-12-02 LAB — BASIC METABOLIC PANEL
Anion gap: 10 (ref 5–15)
BUN: 11 mg/dL (ref 6–20)
CALCIUM: 8.9 mg/dL (ref 8.9–10.3)
CHLORIDE: 101 mmol/L (ref 101–111)
CO2: 28 mmol/L (ref 22–32)
CREATININE: 1.27 mg/dL — AB (ref 0.61–1.24)
Glucose, Bld: 123 mg/dL — ABNORMAL HIGH (ref 65–99)
Potassium: 3.3 mmol/L — ABNORMAL LOW (ref 3.5–5.1)
SODIUM: 139 mmol/L (ref 135–145)

## 2016-12-02 LAB — CBC WITH DIFFERENTIAL/PLATELET
BASOS PCT: 0 %
Basophils Absolute: 0 10*3/uL (ref 0.0–0.1)
EOS ABS: 0.1 10*3/uL (ref 0.0–0.7)
Eosinophils Relative: 2 %
HCT: 44.9 % (ref 39.0–52.0)
HEMOGLOBIN: 15.2 g/dL (ref 13.0–17.0)
Lymphocytes Relative: 48 %
Lymphs Abs: 3.4 10*3/uL (ref 0.7–4.0)
MCH: 29 pg (ref 26.0–34.0)
MCHC: 33.9 g/dL (ref 30.0–36.0)
MCV: 85.5 fL (ref 78.0–100.0)
Monocytes Absolute: 0.8 10*3/uL (ref 0.1–1.0)
Monocytes Relative: 11 %
NEUTROS PCT: 39 %
Neutro Abs: 2.7 10*3/uL (ref 1.7–7.7)
PLATELETS: 220 10*3/uL (ref 150–400)
RBC: 5.25 MIL/uL (ref 4.22–5.81)
RDW: 13.2 % (ref 11.5–15.5)
WBC: 7 10*3/uL (ref 4.0–10.5)

## 2016-12-02 MED ORDER — ONDANSETRON HCL 4 MG/2ML IJ SOLN
4.0000 mg | Freq: Once | INTRAMUSCULAR | Status: AC
  Administered 2016-12-02: 4 mg via INTRAVENOUS
  Filled 2016-12-02: qty 2

## 2016-12-02 MED ORDER — HYDROMORPHONE HCL 2 MG PO TABS: 2.0000 mg | ORAL_TABLET | ORAL | 0 refills | Status: DC | PRN

## 2016-12-02 MED ORDER — HYDROCODONE-ACETAMINOPHEN 5-325 MG PO TABS
1.0000 | ORAL_TABLET | Freq: Once | ORAL | Status: AC
  Administered 2016-12-02: 1 via ORAL
  Filled 2016-12-02: qty 1

## 2016-12-02 MED ORDER — SODIUM CHLORIDE 0.9 % IV BOLUS (SEPSIS)
1000.0000 mL | Freq: Once | INTRAVENOUS | Status: AC
  Administered 2016-12-02: 1000 mL via INTRAVENOUS

## 2016-12-02 MED ORDER — KETOROLAC TROMETHAMINE 30 MG/ML IJ SOLN
30.0000 mg | Freq: Once | INTRAMUSCULAR | Status: AC
  Administered 2016-12-02: 30 mg via INTRAVENOUS
  Filled 2016-12-02: qty 1

## 2016-12-02 NOTE — ED Provider Notes (Signed)
[facility] Provider Note   CSN: [id number] Arrival date & time: 12/02/16  0535     History   Chief Complaint Chief Complaint  Patient presents with  . Flank Pain    HPI Edwin Norton is a 46 y.o. male.  The history is provided by the patient.   He was in emergency 3 days ago and diagnosed with a kidney stone. He been doing well at home until this morning when he had recurrence of left flank pain with some radiation to the left lower abdomen. He rates pain at 9/10. He took 2 tramadol tablets without any benefit. He states he does have a sense of having to defecate, but is unable to do so. He denies fever or chills or sweats. He denies nausea or vomiting. Nothing makes the pain better nothing makes it worse.  Past Medical History:  Diagnosis Date  . Anxiety   . Complication of anesthesia 2005   does not recall name of meds. but makes his ears ring  . Depression   . Dizziness   . GERD (gastroesophageal reflux disease)   . History of nephrolithiasis   . Meniere disease   . Shortness of breath     Patient Active Problem List   Diagnosis Date Noted  . Foreign body in foot 12/13/2011  . Perianal dermatitis 10/22/2011  . GERD (gastroesophageal reflux disease) 06/02/2011  . Hemorrhoidal skin tag 06/02/2011    Past Surgical History:  Procedure Laterality Date  . ANAL FISSURE REPAIR  Sept 2001   Dr. Theodis Sato, [facility]  . ANAL FISSURE REPAIR  11/14/2011   Procedure: ANAL FISSURE REPAIR;  Surgeon: Dalia Heading, MD;  Location: [facility] ORS;  Service: General;  Laterality: N/A;  Excision Skin Tag of Anus  . BRONCHOSCOPY  08/23/06   erythema was found in the left lower lobe  . debridment of chronic granulation tissue and seton placement  Feb 2002   Dr. Theodis Sato, [facility], noted suprasphincteric fistula, with seton placement   . ESOPHAGOGASTRODUODENOSCOPY  04/15/07   patchy erythema in antrum, negative H.pylori   . EXTERNAL EAR SURGERY     right  .  HEMORROIDECTOMY         Home Medications    Prior to Admission medications   Medication Sig Start Date End Date Taking? Authorizing Provider  ALPRAZolam Prudy Feeler) 1 MG tablet Take 1 tablet by mouth 2 (two) times daily. 08/22/16   [provider]  guaiFENesin (MUCINEX) 600 MG 12 hr tablet Take 1,200 mg by mouth 2 (two) times daily as needed for cough or to loosen phlegm.    [provider]  meclizine (ANTIVERT) 25 MG tablet Take 1 tablet (25 mg total) by mouth 3 (three) times daily as needed for dizziness. Patient not taking: Reported on 11/29/2016 03/10/14   Ivery Quale, PA-C  predniSONE (STERAPRED UNI-PAK 21 TAB) 10 MG (21) TBPK tablet Take 10 mg by mouth daily. Dose pak 6,5,4,3,2,1 finished 3days ago    [provider]  scopolamine (TRANSDERM-SCOP) 1 MG/3DAYS Place 1 patch onto the skin daily as needed (dizziness).    [provider]  tamsulosin (FLOMAX) 0.4 MG CAPS capsule 1 q HS to aid stone passage 11/29/16   Mancel Bale, MD  traMADol (ULTRAM) 50 MG tablet Take 1 tablet (50 mg total) by mouth every 6 (six) hours as needed. 11/29/16   Mancel Bale, MD    Family History Family History  Problem Relation Age of Onset  . Diabetes  Unknown   . Colon cancer Neg Hx     Social History Social History  Substance Use Topics  . Smoking status: Former Smoker    Types: Cigarettes  . Smokeless tobacco: Current User    Types: Snuff  . Alcohol use No     Allergies   Morphine and related; Percocet [oxycodone-acetaminophen]; and Vicodin [hydrocodone-acetaminophen]   Review of Systems Review of Systems  All other systems reviewed and are negative.    Physical Exam Updated Vital Signs BP (!) 151/107   Pulse 77   Temp 97.8 F (36.6 C) (Oral)   Resp 20   Ht 5\' 6"  (1.676 m)   Wt 108.9 kg (240 lb)   SpO2 98%   BMI 38.74 kg/m   Physical Exam  Nursing note and vitals reviewed.  46 year old male, resting comfortably and in no acute distress.  Vital signs are significant for hypertension. Oxygen saturation is 98%, which is normal. Head is normocephalic and atraumatic. PERRLA, EOMI. Oropharynx is clear. Neck is nontender and supple without adenopathy or JVD. Back is nontender in the midline. There is mild left CVA tenderness. Lungs are clear without rales, wheezes, or rhonchi. Chest is nontender. Heart has regular rate and rhythm without murmur. Abdomen is soft, flat, with mild tenderness in the left lower abdomen. There is no rebound or guarding. There are no masses or hepatosplenomegaly and peristalsis is hypoactive. Extremities have no cyanosis or edema, full range of motion is present. Skin is warm and dry without rash. Neurologic: Mental status is normal, cranial nerves are intact, there are no motor or sensory deficits.  ED Treatments / Results  Labs (all labs ordered are listed, but only abnormal results are displayed) Labs Reviewed  BASIC METABOLIC PANEL - Abnormal; Notable for the following:       Result Value   Potassium 3.3 (*)    Glucose, Bld 123 (*)    Creatinine, Ser 1.27 (*)    All other components within normal limits  CBC WITH DIFFERENTIAL/PLATELET  URINALYSIS, ROUTINE W REFLEX MICROSCOPIC    Procedures Procedures (including critical care time)  Medications Ordered in ED Medications  sodium chloride 0.9 % bolus 1,000 mL (0 mLs Intravenous Stopped 12/02/16 0741)  ketorolac (TORADOL) 30 MG/ML injection 30 mg (30 mg Intravenous Given 12/02/16 1610)  HYDROcodone-acetaminophen (NORCO/VICODIN) 5-325 MG per tablet 1 tablet (1 tablet Oral Given 12/02/16 0609)  ondansetron (ZOFRAN) injection 4 mg (4 mg Intravenous Given 12/02/16 0609)     Initial Impression / Assessment and Plan / ED Course  I have reviewed the triage vital signs and the nursing notes.  Pertinent labs & imaging results that were available during my care of the patient were reviewed by me and considered in my medical decision making (see chart  for details).  Ureteral colic secondary to known left ureteral calculus. Old records are reviewed confirming ED visit 3 days ago for kidney stone. CT showed 3.3 mm calculus in the left midureter. No indication for repeat imaging. Will check renal function. He'll be given hydromorphone for pain.  He had good relief of pain with above noted treatment. There has been a modest increase in creatinine from 1.10 to 1.27. This is not felt to be clinically significant. He is discharged with prescription for hydromorphone, and instructed to follow-up with urology.  Final Clinical Impressions(s) / ED Diagnoses   Final diagnoses:  Ureteral colic    New Prescriptions Discharge Medication List as of 12/02/2016  7:31 AM  START taking these medications   Details  HYDROmorphone (DILAUDID) 2 MG tablet Take 1 tablet (2 mg total) by mouth every 4 (four) hours as needed for severe pain., Starting Sun 12/02/2016, Print         Dione BoozeGlick, Farren Nelles, MD 12/02/16 [phone]

## 2016-12-02 NOTE — ED Triage Notes (Signed)
Pt c/o left flank pain that returned tonight, was seen in er on 11/29/2016 for same and diagnosed with kidney stones,

## 2016-12-02 NOTE — Discharge Instructions (Signed)
Return if pain is not being adequately controlled, or if you start runninng a fever.

## 2016-12-03 ENCOUNTER — Emergency Department (HOSPITAL_COMMUNITY)
Admission: EM | Admit: 2016-12-03 | Discharge: 2016-12-04 | Disposition: A | Discharge status: Home / Self Care | Payer: Medicare Other | Attending: Emergency Medicine | Admitting: Emergency Medicine

## 2016-12-03 ENCOUNTER — Emergency Department (HOSPITAL_COMMUNITY): Payer: Medicare Other

## 2016-12-03 ENCOUNTER — Encounter (HOSPITAL_COMMUNITY): Payer: Self-pay | Admitting: Emergency Medicine

## 2016-12-03 DIAGNOSIS — N200 Calculus of kidney: Secondary | ICD-10-CM | POA: Insufficient documentation

## 2016-12-03 DIAGNOSIS — R109 Unspecified abdominal pain: Secondary | ICD-10-CM | POA: Diagnosis present

## 2016-12-03 DIAGNOSIS — N23 Unspecified renal colic: Secondary | ICD-10-CM

## 2016-12-03 DIAGNOSIS — Z87891 Personal history of nicotine dependence: Secondary | ICD-10-CM | POA: Insufficient documentation

## 2016-12-03 DIAGNOSIS — Z79899 Other long term (current) drug therapy: Secondary | ICD-10-CM | POA: Insufficient documentation

## 2016-12-03 LAB — BASIC METABOLIC PANEL
Anion gap: 10 (ref 5–15)
BUN: 13 mg/dL (ref 6–20)
CALCIUM: 8.8 mg/dL — AB (ref 8.9–10.3)
CO2: 26 mmol/L (ref 22–32)
Chloride: 104 mmol/L (ref 101–111)
Creatinine, Ser: 1.9 mg/dL — ABNORMAL HIGH (ref 0.61–1.24)
GFR calc Af Amer: 48 mL/min — ABNORMAL LOW (ref >60)
GFR, EST NON AFRICAN AMERICAN: 41 mL/min — AB (ref >60)
GLUCOSE: 117 mg/dL — AB (ref 65–99)
POTASSIUM: 3.8 mmol/L (ref 3.5–5.1)
SODIUM: 140 mmol/L (ref 135–145)

## 2016-12-03 MED ORDER — PROMETHAZINE HCL 12.5 MG PO TABS
25.0000 mg | ORAL_TABLET | Freq: Once | ORAL | Status: AC
  Administered 2016-12-03: 25 mg via ORAL
  Filled 2016-12-03: qty 2

## 2016-12-03 MED ORDER — IOPAMIDOL (ISOVUE-300) INJECTION 61%
100.0000 mL | Freq: Once | INTRAVENOUS | Status: AC | PRN
  Administered 2016-12-04: 80 mL via INTRAVENOUS

## 2016-12-03 MED ORDER — HYDROMORPHONE HCL 1 MG/ML IJ SOLN
1.0000 mg | Freq: Once | INTRAMUSCULAR | Status: AC
  Administered 2016-12-03: 1 mg via INTRAVENOUS
  Filled 2016-12-03: qty 1

## 2016-12-03 MED ORDER — KETOROLAC TROMETHAMINE 30 MG/ML IJ SOLN
30.0000 mg | Freq: Once | INTRAMUSCULAR | Status: AC
  Administered 2016-12-03: 30 mg via INTRAVENOUS
  Filled 2016-12-03: qty 1

## 2016-12-03 MED ORDER — TAMSULOSIN HCL 0.4 MG PO CAPS
0.4000 mg | ORAL_CAPSULE | Freq: Once | ORAL | Status: AC
  Administered 2016-12-03: 0.4 mg via ORAL
  Filled 2016-12-03: qty 1

## 2016-12-03 NOTE — ED Provider Notes (Signed)
[facility] Provider Note   CSN: [id number] Arrival date & time: 12/03/16  2143     History   Chief Complaint Chief Complaint  Patient presents with  . Flank Pain    HPI Edwin Norton is a 46 y.o. male.  Patient is a 46 year old male who presents to the emergency department with a complaint of left flank area pain.  The patient was diagnosed on June 7 with a 3 mm ureteral kidney stone. There was mild to moderate hydronephrosis on present. The patient was treated and referred to urology. The patient returned to the emergency department on June 10 at which time he was having more left flank area pain. He was treated with IV medication with significant improvement. The patient was given on narcotic medication to use at home. The patient states that he continues to have pain and that the narcotic pain medication does not seem to be really helping him a lot. He complains that it causes him to have constipation. He states that he has been nauseated, but has not been vomiting. He's not had any injury to his left flank area. He's not seen any blood in his urine. He's not had any significant temperature elevation to be reported. There's been no previous operations or procedures involving the GU area. Patient presents now for assistance because of his prolonged and increasing pain.      Past Medical History:  Diagnosis Date  . Anxiety   . Complication of anesthesia 2005   does not recall name of meds. but makes his ears ring  . Depression   . Dizziness   . GERD (gastroesophageal reflux disease)   . History of nephrolithiasis   . Meniere disease   . Shortness of breath     Patient Active Problem List   Diagnosis Date Noted  . Foreign body in foot 12/13/2011  . Perianal dermatitis 10/22/2011  . GERD (gastroesophageal reflux disease) 06/02/2011  . Hemorrhoidal skin tag 06/02/2011    Past Surgical History:  Procedure Laterality Date  . ANAL FISSURE REPAIR  Sept 2001   Dr.  Theodis SatoAmjad Bhatti, [facility]  . ANAL FISSURE REPAIR  11/14/2011   Procedure: ANAL FISSURE REPAIR;  Surgeon: Dalia HeadingMark A Jenkins, Edwin Norton;  Location: [facility];  Service: General;  Laterality: N/A;  Excision Skin Tag of Anus  . BRONCHOSCOPY  08/23/06   erythema was found in the left lower lobe  . debridment of chronic granulation tissue and seton placement  Feb 2002   Dr. Theodis [facility]atoAmjad Bhatti, [facility], noted suprasphincteric fistula, with seton placement   . ESOPHAGOGASTRODUODENOSCOPY  04/15/07   patchy erythema in antrum, negative H.pylori   . EXTERNAL EAR SURGERY     right  . HEMORROIDECTOMY         Home Medications    Prior to Admission medications   Medication Sig Start Date End Date Taking? Authorizing Provider  ALPRAZolam Prudy Feeler(XANAX) 1 MG tablet Take 0.5-1 tablets by mouth 2 (two) times daily.  08/22/16  Yes Provider, Historical, Edwin Norton  HYDROmorphone (DILAUDID) 2 MG tablet Take 1 tablet (2 mg total) by mouth every 4 (four) hours as needed for severe pain. 12/02/16  Yes Dione BoozeGlick, David, Edwin Norton  tamsulosin (FLOMAX) 0.4 MG CAPS capsule 1 q HS to aid stone passage Patient taking differently: Take 0.4 mg by mouth at bedtime. to aid stone passage 11/29/16  Yes Mancel BaleWentz, Elliott, Edwin Norton  traMADol (ULTRAM) 50 MG tablet Take 1 tablet (50 mg total) by mouth every 6 (six) hours as  needed. 11/29/16  Yes Mancel Bale, Edwin Norton    Family History Family History  Problem Relation Age of Onset  . Diabetes Unknown   . Colon cancer Neg Hx     Social History Social History  Substance Use Topics  . Smoking status: Former Smoker    Types: Cigarettes  . Smokeless tobacco: Current User    Types: Snuff  . Alcohol use No     Allergies   Morphine and related; Percocet [oxycodone-acetaminophen]; and Vicodin [hydrocodone-acetaminophen]   Review of Systems Review of Systems  Constitutional: Negative for activity change and appetite change.  HENT: Negative for congestion, ear discharge, ear pain, facial swelling, nosebleeds,  rhinorrhea, sneezing and tinnitus.   Eyes: Negative for photophobia, pain and discharge.  Respiratory: Negative for cough, choking, shortness of breath and wheezing.   Cardiovascular: Negative for chest pain, palpitations and leg swelling.  Gastrointestinal: Positive for nausea. Negative for abdominal pain, blood in stool, constipation, diarrhea and vomiting.  Genitourinary: Positive for flank pain. Negative for difficulty urinating, dysuria, frequency and hematuria.  Musculoskeletal: Negative for back pain, gait problem, myalgias and neck pain.  Skin: Negative for color change, rash and wound.  Neurological: Negative for dizziness, seizures, syncope, facial asymmetry, speech difficulty, weakness and numbness.  Hematological: Negative for adenopathy. Does not bruise/bleed easily.  Psychiatric/Behavioral: Negative for agitation, confusion, hallucinations, self-injury and suicidal ideas. The patient is not nervous/anxious.      Physical Exam Updated Vital Signs BP (!) 141/90 (BP Location: Right Arm)   Pulse 70   Temp 98.9 F (37.2 C) (Oral)   Resp 18   Ht 5\' 6"  (1.676 m)   Wt 108.9 kg (240 lb)   SpO2 100%   BMI 38.74 kg/m   Physical Exam  Constitutional: Vital signs are normal. He appears well-developed and well-nourished. He is active.  HENT:  Head: Normocephalic and atraumatic.  Right Ear: Tympanic membrane, external ear and ear canal normal.  Left Ear: Tympanic membrane, external ear and ear canal normal.  Nose: Nose normal.  Mouth/Throat: Uvula is midline, oropharynx is clear and moist and mucous membranes are normal.  Eyes: Conjunctivae, EOM and lids are normal. Pupils are equal, round, and reactive to light.  Neck: Trachea normal, normal range of motion and phonation normal. Neck supple. Carotid bruit is not present.  Cardiovascular: Normal rate, regular rhythm and normal pulses.   Abdominal: Soft. Normal appearance and bowel sounds are normal. There is no splenomegaly or  hepatomegaly. There is CVA tenderness.  Left CVA tenderness.  Lymphadenopathy:       Head (right side): No submental, no preauricular and no posterior auricular adenopathy present.       Head (left side): No submental, no preauricular and no posterior auricular adenopathy present.    He has no cervical adenopathy.  Neurological: He is alert. He has normal strength. No cranial nerve deficit or sensory deficit. GCS eye subscore is 4. GCS verbal subscore is 5. GCS motor subscore is 6.  Skin: Skin is warm and dry.  Psychiatric: His speech is normal.     ED Treatments / Results  Labs (all labs ordered are listed, but only abnormal results are displayed) Labs Reviewed  BASIC METABOLIC PANEL    EKG  EKG Interpretation None       Radiology No results found.  Procedures Procedures (including critical care time)  Medications Ordered in ED Medications  tamsulosin (FLOMAX) capsule 0.4 mg (0.4 mg Oral Given 12/03/16 2247)  ketorolac (TORADOL) 30 MG/ML injection 30  mg (30 mg Intravenous Given 12/03/16 2248)  HYDROmorphone (DILAUDID) injection 1 mg (1 mg Intravenous Given 12/03/16 2248)  promethazine (PHENERGAN) tablet 25 mg (25 mg Oral Given 12/03/16 2248)     Initial Impression / Assessment and Plan / ED Course  I have reviewed the triage vital signs and the nursing notes.  Pertinent labs & imaging results that were available during my care of the patient were reviewed by me and considered in my medical decision making (see chart for details).       Final Clinical Impressions(s) / ED Diagnoses MDM Vital signs within normal limits. Will obtain a CT abdomen pelvis with contrast rule out the possibility of pyelonephritis, mass, or other complications that could be causing the flank area pain.  Patient was treated in the emergency department with Flomax, Toradol, and Dilaudid.  Recheck the patient feels significantly improved. CT scan shows that the stone has moved from the mid  ureter to an area near the bladder. The creatinine is elevated from the previous study, but I feel that this is probably related to the kidney stone. I've asked the patient to continue to strain all urine. I've asked patient to keep his appointment with urology as planned. The patient was given medication for pain on last evening. And he is asked to continue to do that medication.    Final diagnoses:  None    New Prescriptions New Prescriptions   No medications on file     Duayne Cal 12/04/16 0114    Edwin Norton, Edwin Cower, Edwin Norton 12/05/16 [phone]

## 2016-12-03 NOTE — ED Triage Notes (Signed)
Lt flank pain x 5 days.  Has been seen here for same.  Pt states he has appointment next week but he cant take the pain.

## 2016-12-04 MED ORDER — HYDROMORPHONE HCL 2 MG PO TABS: 2.0000 mg | ORAL_TABLET | ORAL | 0 refills | Status: DC | PRN

## 2016-12-04 NOTE — Discharge Instructions (Signed)
Your kidney stone has moved from the middle of your ureter to the area just before falling into your bladder. This is probably why you've been having some much discomfort on. There is no abscess appreciated. There is no mass appreciated in the renal area. Your vital signs within normal limits. One of your kidney functions called creatinine is still going up, but this is most likely related to the kidney stone. Please keep your appointment with urology specialist as previously arranged. Please strain your urine. Continue your current medications.

## 2017-11-11 DIAGNOSIS — Z6841 Body Mass Index (BMI) 40.0 and over, adult: Secondary | ICD-10-CM | POA: Diagnosis not present

## 2017-11-11 DIAGNOSIS — B079 Viral wart, unspecified: Secondary | ICD-10-CM | POA: Diagnosis not present

## 2017-11-11 DIAGNOSIS — Z0000 Encounter for general adult medical examination without abnormal findings: Secondary | ICD-10-CM | POA: Diagnosis not present

## 2017-11-11 DIAGNOSIS — R7309 Other abnormal glucose: Secondary | ICD-10-CM | POA: Diagnosis not present

## 2017-11-11 DIAGNOSIS — Z1389 Encounter for screening for other disorder: Secondary | ICD-10-CM | POA: Diagnosis not present

## 2017-11-11 DIAGNOSIS — Z0001 Encounter for general adult medical examination with abnormal findings: Secondary | ICD-10-CM | POA: Diagnosis not present

## 2017-11-11 DIAGNOSIS — L309 Dermatitis, unspecified: Secondary | ICD-10-CM | POA: Diagnosis not present

## 2017-11-19 DIAGNOSIS — Z6841 Body Mass Index (BMI) 40.0 and over, adult: Secondary | ICD-10-CM | POA: Diagnosis not present

## 2017-11-19 DIAGNOSIS — E119 Type 2 diabetes mellitus without complications: Secondary | ICD-10-CM | POA: Diagnosis not present

## 2017-11-19 DIAGNOSIS — Z1389 Encounter for screening for other disorder: Secondary | ICD-10-CM | POA: Diagnosis not present

## 2017-12-12 DIAGNOSIS — G4733 Obstructive sleep apnea (adult) (pediatric): Secondary | ICD-10-CM | POA: Diagnosis not present

## 2018-02-13 DIAGNOSIS — H9193 Unspecified hearing loss, bilateral: Secondary | ICD-10-CM | POA: Diagnosis not present

## 2018-02-13 DIAGNOSIS — H9393 Unspecified disorder of ear, bilateral: Secondary | ICD-10-CM | POA: Diagnosis not present

## 2018-02-20 DIAGNOSIS — R5383 Other fatigue: Secondary | ICD-10-CM | POA: Diagnosis not present

## 2018-02-20 DIAGNOSIS — E119 Type 2 diabetes mellitus without complications: Secondary | ICD-10-CM | POA: Diagnosis not present

## 2018-02-20 DIAGNOSIS — Z6841 Body Mass Index (BMI) 40.0 and over, adult: Secondary | ICD-10-CM | POA: Diagnosis not present

## 2018-02-21 DIAGNOSIS — R5383 Other fatigue: Secondary | ICD-10-CM | POA: Diagnosis not present

## 2018-02-21 DIAGNOSIS — Z6841 Body Mass Index (BMI) 40.0 and over, adult: Secondary | ICD-10-CM | POA: Diagnosis not present

## 2018-02-21 DIAGNOSIS — E119 Type 2 diabetes mellitus without complications: Secondary | ICD-10-CM | POA: Diagnosis not present

## 2018-03-20 DIAGNOSIS — H5200 Hypermetropia, unspecified eye: Secondary | ICD-10-CM | POA: Diagnosis not present

## 2018-04-01 DIAGNOSIS — G4733 Obstructive sleep apnea (adult) (pediatric): Secondary | ICD-10-CM | POA: Diagnosis not present

## 2018-04-22 DIAGNOSIS — H8109 Meniere's disease, unspecified ear: Secondary | ICD-10-CM | POA: Diagnosis not present

## 2018-04-24 DIAGNOSIS — R7989 Other specified abnormal findings of blood chemistry: Secondary | ICD-10-CM | POA: Diagnosis not present

## 2018-07-10 DIAGNOSIS — J069 Acute upper respiratory infection, unspecified: Secondary | ICD-10-CM | POA: Diagnosis not present

## 2018-07-10 DIAGNOSIS — J019 Acute sinusitis, unspecified: Secondary | ICD-10-CM | POA: Diagnosis not present

## 2018-07-11 DIAGNOSIS — G4733 Obstructive sleep apnea (adult) (pediatric): Secondary | ICD-10-CM | POA: Diagnosis not present

## 2018-07-15 DIAGNOSIS — Z1389 Encounter for screening for other disorder: Secondary | ICD-10-CM | POA: Diagnosis not present

## 2018-07-15 DIAGNOSIS — Z6841 Body Mass Index (BMI) 40.0 and over, adult: Secondary | ICD-10-CM | POA: Diagnosis not present

## 2018-07-15 DIAGNOSIS — L089 Local infection of the skin and subcutaneous tissue, unspecified: Secondary | ICD-10-CM | POA: Diagnosis not present

## 2018-07-29 DIAGNOSIS — G4733 Obstructive sleep apnea (adult) (pediatric): Secondary | ICD-10-CM | POA: Diagnosis not present

## 2018-07-29 DIAGNOSIS — H8109 Meniere's disease, unspecified ear: Secondary | ICD-10-CM | POA: Diagnosis not present

## 2018-08-05 DIAGNOSIS — E291 Testicular hypofunction: Secondary | ICD-10-CM | POA: Diagnosis not present

## 2018-08-06 DIAGNOSIS — G4733 Obstructive sleep apnea (adult) (pediatric): Secondary | ICD-10-CM | POA: Diagnosis not present

## 2018-09-04 DIAGNOSIS — G4733 Obstructive sleep apnea (adult) (pediatric): Secondary | ICD-10-CM | POA: Diagnosis not present

## 2018-09-12 DIAGNOSIS — G4733 Obstructive sleep apnea (adult) (pediatric): Secondary | ICD-10-CM | POA: Diagnosis not present

## 2018-09-23 DIAGNOSIS — M791 Myalgia, unspecified site: Secondary | ICD-10-CM | POA: Diagnosis not present

## 2018-09-23 DIAGNOSIS — Z6841 Body Mass Index (BMI) 40.0 and over, adult: Secondary | ICD-10-CM | POA: Diagnosis not present

## 2018-09-23 DIAGNOSIS — M7551 Bursitis of right shoulder: Secondary | ICD-10-CM | POA: Diagnosis not present

## 2018-10-05 DIAGNOSIS — G4733 Obstructive sleep apnea (adult) (pediatric): Secondary | ICD-10-CM | POA: Diagnosis not present

## 2018-10-13 DIAGNOSIS — G4733 Obstructive sleep apnea (adult) (pediatric): Secondary | ICD-10-CM | POA: Diagnosis not present

## 2018-10-16 DIAGNOSIS — G4733 Obstructive sleep apnea (adult) (pediatric): Secondary | ICD-10-CM | POA: Diagnosis not present

## 2018-10-20 DIAGNOSIS — M9902 Segmental and somatic dysfunction of thoracic region: Secondary | ICD-10-CM | POA: Diagnosis not present

## 2018-10-20 DIAGNOSIS — M5412 Radiculopathy, cervical region: Secondary | ICD-10-CM | POA: Diagnosis not present

## 2018-10-20 DIAGNOSIS — M9901 Segmental and somatic dysfunction of cervical region: Secondary | ICD-10-CM | POA: Diagnosis not present

## 2018-10-20 DIAGNOSIS — M25511 Pain in right shoulder: Secondary | ICD-10-CM | POA: Diagnosis not present

## 2018-10-22 DIAGNOSIS — M25511 Pain in right shoulder: Secondary | ICD-10-CM | POA: Diagnosis not present

## 2018-10-22 DIAGNOSIS — M9902 Segmental and somatic dysfunction of thoracic region: Secondary | ICD-10-CM | POA: Diagnosis not present

## 2018-10-22 DIAGNOSIS — M9901 Segmental and somatic dysfunction of cervical region: Secondary | ICD-10-CM | POA: Diagnosis not present

## 2018-10-22 DIAGNOSIS — M5412 Radiculopathy, cervical region: Secondary | ICD-10-CM | POA: Diagnosis not present

## 2018-10-27 DIAGNOSIS — M25511 Pain in right shoulder: Secondary | ICD-10-CM | POA: Diagnosis not present

## 2018-10-27 DIAGNOSIS — M9901 Segmental and somatic dysfunction of cervical region: Secondary | ICD-10-CM | POA: Diagnosis not present

## 2018-10-27 DIAGNOSIS — M9902 Segmental and somatic dysfunction of thoracic region: Secondary | ICD-10-CM | POA: Diagnosis not present

## 2018-10-27 DIAGNOSIS — M5412 Radiculopathy, cervical region: Secondary | ICD-10-CM | POA: Diagnosis not present

## 2018-10-29 DIAGNOSIS — M9901 Segmental and somatic dysfunction of cervical region: Secondary | ICD-10-CM | POA: Diagnosis not present

## 2018-10-29 DIAGNOSIS — M5412 Radiculopathy, cervical region: Secondary | ICD-10-CM | POA: Diagnosis not present

## 2018-10-29 DIAGNOSIS — M25511 Pain in right shoulder: Secondary | ICD-10-CM | POA: Diagnosis not present

## 2018-10-29 DIAGNOSIS — M9902 Segmental and somatic dysfunction of thoracic region: Secondary | ICD-10-CM | POA: Diagnosis not present

## 2018-10-31 DIAGNOSIS — M9901 Segmental and somatic dysfunction of cervical region: Secondary | ICD-10-CM | POA: Diagnosis not present

## 2018-10-31 DIAGNOSIS — M5412 Radiculopathy, cervical region: Secondary | ICD-10-CM | POA: Diagnosis not present

## 2018-10-31 DIAGNOSIS — M25511 Pain in right shoulder: Secondary | ICD-10-CM | POA: Diagnosis not present

## 2018-10-31 DIAGNOSIS — M9902 Segmental and somatic dysfunction of thoracic region: Secondary | ICD-10-CM | POA: Diagnosis not present

## 2018-11-03 DIAGNOSIS — M25511 Pain in right shoulder: Secondary | ICD-10-CM | POA: Diagnosis not present

## 2018-11-03 DIAGNOSIS — M5412 Radiculopathy, cervical region: Secondary | ICD-10-CM | POA: Diagnosis not present

## 2018-11-03 DIAGNOSIS — M9901 Segmental and somatic dysfunction of cervical region: Secondary | ICD-10-CM | POA: Diagnosis not present

## 2018-11-03 DIAGNOSIS — M9902 Segmental and somatic dysfunction of thoracic region: Secondary | ICD-10-CM | POA: Diagnosis not present

## 2018-11-04 DIAGNOSIS — G4733 Obstructive sleep apnea (adult) (pediatric): Secondary | ICD-10-CM | POA: Diagnosis not present

## 2018-11-05 DIAGNOSIS — M9901 Segmental and somatic dysfunction of cervical region: Secondary | ICD-10-CM | POA: Diagnosis not present

## 2018-11-05 DIAGNOSIS — M25511 Pain in right shoulder: Secondary | ICD-10-CM | POA: Diagnosis not present

## 2018-11-05 DIAGNOSIS — M9902 Segmental and somatic dysfunction of thoracic region: Secondary | ICD-10-CM | POA: Diagnosis not present

## 2018-11-05 DIAGNOSIS — M5412 Radiculopathy, cervical region: Secondary | ICD-10-CM | POA: Diagnosis not present

## 2018-11-07 DIAGNOSIS — M9901 Segmental and somatic dysfunction of cervical region: Secondary | ICD-10-CM | POA: Diagnosis not present

## 2018-11-07 DIAGNOSIS — M5412 Radiculopathy, cervical region: Secondary | ICD-10-CM | POA: Diagnosis not present

## 2018-11-07 DIAGNOSIS — M9902 Segmental and somatic dysfunction of thoracic region: Secondary | ICD-10-CM | POA: Diagnosis not present

## 2018-11-07 DIAGNOSIS — M25511 Pain in right shoulder: Secondary | ICD-10-CM | POA: Diagnosis not present

## 2018-11-10 ENCOUNTER — Other Ambulatory Visit (HOSPITAL_COMMUNITY): Payer: Self-pay | Admitting: Sports Medicine

## 2018-11-10 DIAGNOSIS — M5412 Radiculopathy, cervical region: Secondary | ICD-10-CM

## 2018-11-10 DIAGNOSIS — M5030 Other cervical disc degeneration, unspecified cervical region: Secondary | ICD-10-CM

## 2018-11-10 DIAGNOSIS — M25511 Pain in right shoulder: Secondary | ICD-10-CM | POA: Diagnosis not present

## 2018-11-12 DIAGNOSIS — M9902 Segmental and somatic dysfunction of thoracic region: Secondary | ICD-10-CM | POA: Diagnosis not present

## 2018-11-12 DIAGNOSIS — M25511 Pain in right shoulder: Secondary | ICD-10-CM | POA: Diagnosis not present

## 2018-11-12 DIAGNOSIS — M5412 Radiculopathy, cervical region: Secondary | ICD-10-CM | POA: Diagnosis not present

## 2018-11-12 DIAGNOSIS — G4733 Obstructive sleep apnea (adult) (pediatric): Secondary | ICD-10-CM | POA: Diagnosis not present

## 2018-11-12 DIAGNOSIS — M9901 Segmental and somatic dysfunction of cervical region: Secondary | ICD-10-CM | POA: Diagnosis not present

## 2018-11-14 ENCOUNTER — Encounter (HOSPITAL_COMMUNITY): Payer: Self-pay

## 2018-11-14 ENCOUNTER — Ambulatory Visit (HOSPITAL_COMMUNITY): Admission: RE | Admit: 2018-11-14 | Payer: Medicare HMO | Source: Ambulatory Visit

## 2018-11-14 DIAGNOSIS — R69 Illness, unspecified: Secondary | ICD-10-CM | POA: Diagnosis not present

## 2018-11-25 DIAGNOSIS — M25511 Pain in right shoulder: Secondary | ICD-10-CM | POA: Diagnosis not present

## 2018-11-28 DIAGNOSIS — M9902 Segmental and somatic dysfunction of thoracic region: Secondary | ICD-10-CM | POA: Diagnosis not present

## 2018-11-28 DIAGNOSIS — M25511 Pain in right shoulder: Secondary | ICD-10-CM | POA: Diagnosis not present

## 2018-11-28 DIAGNOSIS — M5412 Radiculopathy, cervical region: Secondary | ICD-10-CM | POA: Diagnosis not present

## 2018-11-28 DIAGNOSIS — M9901 Segmental and somatic dysfunction of cervical region: Secondary | ICD-10-CM | POA: Diagnosis not present

## 2018-12-03 DIAGNOSIS — M5412 Radiculopathy, cervical region: Secondary | ICD-10-CM | POA: Diagnosis not present

## 2018-12-03 DIAGNOSIS — M9901 Segmental and somatic dysfunction of cervical region: Secondary | ICD-10-CM | POA: Diagnosis not present

## 2018-12-03 DIAGNOSIS — M9902 Segmental and somatic dysfunction of thoracic region: Secondary | ICD-10-CM | POA: Diagnosis not present

## 2018-12-03 DIAGNOSIS — M25511 Pain in right shoulder: Secondary | ICD-10-CM | POA: Diagnosis not present

## 2018-12-04 ENCOUNTER — Ambulatory Visit (HOSPITAL_COMMUNITY)
Admission: RE | Admit: 2018-12-04 | Discharge: 2018-12-04 | Disposition: A | Discharge status: Home / Self Care | Payer: Medicare HMO | Source: Ambulatory Visit | Attending: Sports Medicine | Admitting: Sports Medicine

## 2018-12-04 ENCOUNTER — Other Ambulatory Visit: Payer: Self-pay

## 2018-12-04 DIAGNOSIS — M5412 Radiculopathy, cervical region: Secondary | ICD-10-CM

## 2018-12-04 DIAGNOSIS — M5030 Other cervical disc degeneration, unspecified cervical region: Secondary | ICD-10-CM | POA: Diagnosis not present

## 2018-12-04 DIAGNOSIS — M50122 Cervical disc disorder at C5-C6 level with radiculopathy: Secondary | ICD-10-CM | POA: Diagnosis not present

## 2018-12-05 DIAGNOSIS — G4733 Obstructive sleep apnea (adult) (pediatric): Secondary | ICD-10-CM | POA: Diagnosis not present

## 2018-12-17 DIAGNOSIS — M9902 Segmental and somatic dysfunction of thoracic region: Secondary | ICD-10-CM | POA: Diagnosis not present

## 2018-12-17 DIAGNOSIS — M5412 Radiculopathy, cervical region: Secondary | ICD-10-CM | POA: Diagnosis not present

## 2018-12-17 DIAGNOSIS — M25511 Pain in right shoulder: Secondary | ICD-10-CM | POA: Diagnosis not present

## 2018-12-17 DIAGNOSIS — M9901 Segmental and somatic dysfunction of cervical region: Secondary | ICD-10-CM | POA: Diagnosis not present

## 2018-12-24 DIAGNOSIS — M5412 Radiculopathy, cervical region: Secondary | ICD-10-CM | POA: Diagnosis not present

## 2018-12-24 DIAGNOSIS — M9901 Segmental and somatic dysfunction of cervical region: Secondary | ICD-10-CM | POA: Diagnosis not present

## 2018-12-24 DIAGNOSIS — M25511 Pain in right shoulder: Secondary | ICD-10-CM | POA: Diagnosis not present

## 2018-12-24 DIAGNOSIS — M9902 Segmental and somatic dysfunction of thoracic region: Secondary | ICD-10-CM | POA: Diagnosis not present

## 2018-12-30 DIAGNOSIS — M542 Cervicalgia: Secondary | ICD-10-CM | POA: Diagnosis not present

## 2018-12-30 DIAGNOSIS — M5412 Radiculopathy, cervical region: Secondary | ICD-10-CM | POA: Diagnosis not present

## 2018-12-30 DIAGNOSIS — M5030 Other cervical disc degeneration, unspecified cervical region: Secondary | ICD-10-CM | POA: Diagnosis not present

## 2019-01-04 DIAGNOSIS — G4733 Obstructive sleep apnea (adult) (pediatric): Secondary | ICD-10-CM | POA: Diagnosis not present

## 2019-01-10 DIAGNOSIS — E1165 Type 2 diabetes mellitus with hyperglycemia: Secondary | ICD-10-CM | POA: Diagnosis not present

## 2019-02-04 DIAGNOSIS — G4733 Obstructive sleep apnea (adult) (pediatric): Secondary | ICD-10-CM | POA: Diagnosis not present

## 2019-02-13 DIAGNOSIS — G4733 Obstructive sleep apnea (adult) (pediatric): Secondary | ICD-10-CM | POA: Diagnosis not present

## 2019-02-17 DIAGNOSIS — J329 Chronic sinusitis, unspecified: Secondary | ICD-10-CM | POA: Diagnosis not present

## 2019-02-23 DIAGNOSIS — M9903 Segmental and somatic dysfunction of lumbar region: Secondary | ICD-10-CM | POA: Diagnosis not present

## 2019-02-23 DIAGNOSIS — M9901 Segmental and somatic dysfunction of cervical region: Secondary | ICD-10-CM | POA: Diagnosis not present

## 2019-02-23 DIAGNOSIS — M5412 Radiculopathy, cervical region: Secondary | ICD-10-CM | POA: Diagnosis not present

## 2019-02-23 DIAGNOSIS — M545 Low back pain: Secondary | ICD-10-CM | POA: Diagnosis not present

## 2019-02-23 DIAGNOSIS — M546 Pain in thoracic spine: Secondary | ICD-10-CM | POA: Diagnosis not present

## 2019-02-23 DIAGNOSIS — M9902 Segmental and somatic dysfunction of thoracic region: Secondary | ICD-10-CM | POA: Diagnosis not present

## 2019-02-26 DIAGNOSIS — Z6841 Body Mass Index (BMI) 40.0 and over, adult: Secondary | ICD-10-CM | POA: Diagnosis not present

## 2019-02-26 DIAGNOSIS — E1165 Type 2 diabetes mellitus with hyperglycemia: Secondary | ICD-10-CM | POA: Diagnosis not present

## 2019-02-26 DIAGNOSIS — R109 Unspecified abdominal pain: Secondary | ICD-10-CM | POA: Diagnosis not present

## 2019-02-27 DIAGNOSIS — Z6841 Body Mass Index (BMI) 40.0 and over, adult: Secondary | ICD-10-CM | POA: Diagnosis not present

## 2019-02-27 DIAGNOSIS — E1165 Type 2 diabetes mellitus with hyperglycemia: Secondary | ICD-10-CM | POA: Diagnosis not present

## 2019-02-27 DIAGNOSIS — R109 Unspecified abdominal pain: Secondary | ICD-10-CM | POA: Diagnosis not present

## 2019-03-04 DIAGNOSIS — M9901 Segmental and somatic dysfunction of cervical region: Secondary | ICD-10-CM | POA: Diagnosis not present

## 2019-03-04 DIAGNOSIS — M9903 Segmental and somatic dysfunction of lumbar region: Secondary | ICD-10-CM | POA: Diagnosis not present

## 2019-03-04 DIAGNOSIS — M545 Low back pain: Secondary | ICD-10-CM | POA: Diagnosis not present

## 2019-03-04 DIAGNOSIS — M5412 Radiculopathy, cervical region: Secondary | ICD-10-CM | POA: Diagnosis not present

## 2019-03-04 DIAGNOSIS — M9902 Segmental and somatic dysfunction of thoracic region: Secondary | ICD-10-CM | POA: Diagnosis not present

## 2019-03-04 DIAGNOSIS — M546 Pain in thoracic spine: Secondary | ICD-10-CM | POA: Diagnosis not present

## 2019-03-07 DIAGNOSIS — G4733 Obstructive sleep apnea (adult) (pediatric): Secondary | ICD-10-CM | POA: Diagnosis not present

## 2019-03-09 ENCOUNTER — Encounter: Payer: Self-pay | Admitting: Gastroenterology

## 2019-03-11 DIAGNOSIS — M9901 Segmental and somatic dysfunction of cervical region: Secondary | ICD-10-CM | POA: Diagnosis not present

## 2019-03-11 DIAGNOSIS — M5412 Radiculopathy, cervical region: Secondary | ICD-10-CM | POA: Diagnosis not present

## 2019-03-11 DIAGNOSIS — M545 Low back pain: Secondary | ICD-10-CM | POA: Diagnosis not present

## 2019-03-11 DIAGNOSIS — M546 Pain in thoracic spine: Secondary | ICD-10-CM | POA: Diagnosis not present

## 2019-03-11 DIAGNOSIS — M9903 Segmental and somatic dysfunction of lumbar region: Secondary | ICD-10-CM | POA: Diagnosis not present

## 2019-03-11 DIAGNOSIS — M9902 Segmental and somatic dysfunction of thoracic region: Secondary | ICD-10-CM | POA: Diagnosis not present

## 2019-03-18 DIAGNOSIS — M9901 Segmental and somatic dysfunction of cervical region: Secondary | ICD-10-CM | POA: Diagnosis not present

## 2019-03-18 DIAGNOSIS — M546 Pain in thoracic spine: Secondary | ICD-10-CM | POA: Diagnosis not present

## 2019-03-18 DIAGNOSIS — M9903 Segmental and somatic dysfunction of lumbar region: Secondary | ICD-10-CM | POA: Diagnosis not present

## 2019-03-18 DIAGNOSIS — M545 Low back pain: Secondary | ICD-10-CM | POA: Diagnosis not present

## 2019-03-18 DIAGNOSIS — M5412 Radiculopathy, cervical region: Secondary | ICD-10-CM | POA: Diagnosis not present

## 2019-03-18 DIAGNOSIS — M9902 Segmental and somatic dysfunction of thoracic region: Secondary | ICD-10-CM | POA: Diagnosis not present

## 2019-03-25 DIAGNOSIS — H8103 Meniere's disease, bilateral: Secondary | ICD-10-CM | POA: Diagnosis not present

## 2019-03-25 DIAGNOSIS — E119 Type 2 diabetes mellitus without complications: Secondary | ICD-10-CM | POA: Diagnosis not present

## 2019-03-30 DIAGNOSIS — M546 Pain in thoracic spine: Secondary | ICD-10-CM | POA: Diagnosis not present

## 2019-03-30 DIAGNOSIS — M9901 Segmental and somatic dysfunction of cervical region: Secondary | ICD-10-CM | POA: Diagnosis not present

## 2019-03-30 DIAGNOSIS — M5412 Radiculopathy, cervical region: Secondary | ICD-10-CM | POA: Diagnosis not present

## 2019-03-30 DIAGNOSIS — M545 Low back pain: Secondary | ICD-10-CM | POA: Diagnosis not present

## 2019-03-30 DIAGNOSIS — M9902 Segmental and somatic dysfunction of thoracic region: Secondary | ICD-10-CM | POA: Diagnosis not present

## 2019-03-30 DIAGNOSIS — M9903 Segmental and somatic dysfunction of lumbar region: Secondary | ICD-10-CM | POA: Diagnosis not present

## 2019-04-02 DIAGNOSIS — G4733 Obstructive sleep apnea (adult) (pediatric): Secondary | ICD-10-CM | POA: Diagnosis not present

## 2019-04-02 NOTE — Progress Notes (Signed)
Referring Provider: Samuella BruinMann, Edwin L, PA-C Primary Care Physician:  Assunta FoundGolding, John, MD Primary Gastroenterologist:  Dr. Darrick PennaFields  Chief Complaint  Patient presents with  . Abdominal Pain    bloating "all in stomach"    HPI:   [name] is a 48 y.o. male presenting today at the request of Shawnie DapperMann, Edwin L, PA-C for persistent stomach pain.  Patient has not been seen in our office since 2013.  Last EGD on 04/15/2007 for nausea, vomiting, and abdominal pain.  Findings with patchy erythema in the antrum status post biopsy for H. pylori.  Otherwise normal stomach, normal esophagus, normal duodenal bulb and second portion of duodenum.  Biopsy negative for H. Pylori.  Today he states his stomach has been hurting. Used to have 3 BMs daily. Now 1 daily. Trying to eat better for diabetes. Less sugar. Cut out starches. Drinking green tea. No soda. Feels like he is bloated all the time. Pain is burning at times, but primarily bloating. No sharp, dull, or cramping pain. First notice about 6 months ago. This is when he started changing his diet and was started on diabetes medicatons. States he has always had some bloating.   No reflux symptoms with omeprazole. No nausea or vomiting. No dysphagia. No weight loss. No blood in the stool or black stools. Stools are a little hard at times. Most of the time soft. For the last 4-5 days will have straining. Stools used to be looser. Now are bristol 4. Used to be bristol 6. Feels BMs are incomplete. No blood in the stool. No black stools.   No identified triggers of bloating. Nothing makes it better. Tried pepcid. This didn't help. Bloating improves some after meals. Doesn't improve much after a BM. Doesn't pass much gas.   Drinks sparkling water maybe 2-3 a week. No milk. Cheese about 3 times a week. Eats yogurt a few times a week. Uses creamer.   Tried probiotic. Has not tried anything to help with bowel movements. Had been on antibiotics a couple months ago  for chest cold, but bloating present prior to this.   No NSAIDs.   Recent labs- at PCP. 2 weeks ago.   Past Medical History:  Diagnosis Date  . Anxiety   . Complication of anesthesia 2005   does not recall name of meds. but makes his ears ring  . Depression   . Diabetes (HCC)   . Dizziness   . GERD (gastroesophageal reflux disease)   . History of nephrolithiasis   . HLD (hyperlipidemia)   . Meniere disease   . Shortness of breath     Past Surgical History:  Procedure Laterality Date  . ANAL FISSURE REPAIR  Sept 2001   Dr. Theodis SatoAmjad Bhatti, [facility]  . ANAL FISSURE REPAIR  11/14/2011   Procedure: ANAL FISSURE REPAIR;  Surgeon: Dalia HeadingMark A Jenkins, MD;  Location: [facility];  Service: General;  Laterality: N/A;  Excision Skin Tag of Anus  . BRONCHOSCOPY  08/23/06   erythema was found in the left lower lobe  . debridment of chronic granulation tissue and seton placement  Feb 2002   Dr. Theodis SatoAmjad Bhatti, [facility], noted suprasphincteric fistula, with seton placement   . ESOPHAGOGASTRODUODENOSCOPY  04/15/07   patchy erythema in antrum, negative H.pylori   . EXTERNAL EAR SURGERY     right  . HEMORROIDECTOMY      Current Outpatient Medications  Medication Sig Dispense Refill  . ALPRAZolam (XANAX) 1 MG tablet Take 0.5-1 tablets  by mouth 2 (two) times daily.     Marland Kitchen atorvastatin (LIPITOR) 40 MG tablet Take 40 mg by mouth at bedtime.    . Coenzyme Q10 (COQ-10 PO) Take by mouth at bedtime.    Marland Kitchen glimepiride (AMARYL) 2 MG tablet Take 2 mg by mouth at bedtime.    Javier Docker Oil 500 MG CAPS Take by mouth at bedtime.    . metFORMIN (GLUCOPHAGE) 500 MG tablet Take by mouth 2 (two) times daily.    . Multiple Vitamins-Minerals (ICAPS PO) Take by mouth daily.    Marland Kitchen omeprazole (PRILOSEC) 40 MG capsule Take 40 mg by mouth at bedtime.     No current facility-administered medications for this visit.     Allergies as of 04/03/2019 - Review Complete 04/03/2019  Allergen Reaction Noted  .  Morphine and related  11/29/2016  . Percocet [oxycodone-acetaminophen] Itching 05/13/2011  . Vicodin [hydrocodone-acetaminophen] Itching 05/13/2011    Family History  Problem Relation Age of Onset  . Diabetes Other   . Colon polyps Mother   . Colon cancer Neg Hx     Social History   Socioeconomic History  . Marital status: Single    Spouse name: Not on file  . Number of children: Not on file  . Years of education: 8  . Highest education level: Not on file  Occupational History    Employer: NOT EMPLOYED  Social Needs  . Financial resource strain: Not on file  . Food insecurity    Worry: Not on file    Inability: Not on file  . Transportation needs    Medical: Not on file    Non-medical: Not on file  Tobacco Use  . Smoking status: Former Smoker    Types: Cigarettes  . Smokeless tobacco: Current User    Types: Snuff  Substance and Sexual Activity  . Alcohol use: No  . Drug use: No  . Sexual activity: Not on file  Lifestyle  . Physical activity    Days per week: Not on file    Minutes per session: Not on file  . Stress: Not on file  Relationships  . Social Herbalist on phone: Not on file    Gets together: Not on file    Attends religious service: Not on file    Active member of club or organization: Not on file    Attends meetings of clubs or organizations: Not on file    Relationship status: Not on file  . Intimate partner violence    Fear of current or ex partner: Not on file    Emotionally abused: Not on file    Physically abused: Not on file    Forced sexual activity: Not on file  Other Topics Concern  . Not on file  Social History Narrative  . Not on file    Review of Systems: Gen: Denies any fever, chills. Admits to dizziness with history of Mnire's disease. CV: Denies chest pain, heart palpitations Resp: Denies shortness of breath or cough. GI: See HPI GU : Denies urinary burning, urinary frequency, urinary hesitancy MS: Denies  joint pain Derm: Denies rash Psych: Denies depression, anxiety Heme: Denies bruising, bleeding  Physical Exam: BP 119/81   Pulse 76   Temp (!) 97.1 F (36.2 C) (Temporal)   Ht 5\' 6"  (1.676 m)   Wt 250 lb 6.4 oz (113.6 kg)   BMI 40.42 kg/m  General:   Alert and oriented. Pleasant and cooperative. Well-nourished and well-developed.  Head:  Normocephalic and atraumatic. Eyes:  Without icterus, sclera clear and conjunctiva pink.  Ears:  Normal auditory acuity. Nose:  No deformity, discharge,  or lesions. Lungs:  Clear to auscultation bilaterally. No wheezes, rales, or rhonchi. No distress.  Heart:  S1, S2 present without murmurs appreciated.  Abdomen:  +BS, soft, non-tender and non-distended. Small firm mass in the RLQ. Non-tender. Doesn't reduce. No HSM noted. No guarding or rebound. No masses appreciated.  Rectal:  Deferred  Msk:  Symmetrical without gross deformities. Normal posture. Pulses:  Normal pulses noted. Extremities:  Without edema. Neurologic:  Alert and  oriented x4;  grossly normal neurologically. Skin:  Intact without significant lesions or rashes. Psych: Normal mood and affect.

## 2019-04-03 ENCOUNTER — Other Ambulatory Visit: Payer: Self-pay

## 2019-04-03 ENCOUNTER — Encounter: Payer: Self-pay | Admitting: Gastroenterology

## 2019-04-03 ENCOUNTER — Ambulatory Visit (INDEPENDENT_AMBULATORY_CARE_PROVIDER_SITE_OTHER): Payer: Medicare HMO | Admitting: Gastroenterology

## 2019-04-03 VITALS — BP 119/81 | HR 76 | Temp 97.1°F | Ht 66.0 in | Wt 250.4 lb

## 2019-04-03 DIAGNOSIS — R1903 Right lower quadrant abdominal swelling, mass and lump: Secondary | ICD-10-CM | POA: Insufficient documentation

## 2019-04-03 DIAGNOSIS — K219 Gastro-esophageal reflux disease without esophagitis: Secondary | ICD-10-CM | POA: Diagnosis not present

## 2019-04-03 DIAGNOSIS — R140 Abdominal distension (gaseous): Secondary | ICD-10-CM | POA: Diagnosis not present

## 2019-04-03 NOTE — Patient Instructions (Addendum)
I have ordered an [place] of your right lower abdomen to evaluate the mass/nodule. You should receive a call with a date and time.   Please add probiotic- Philips colon health, align, or digestive advantage are good choices.   Start MiraLAX 1 capful (17g) daily. If you develop diarrhea, you may decrease frequency.   Be sure you are drinking plenty of water.   Avoid these foods:  ? Foods that cause gas, such as broccoli, cabbage, cauliflower, and baked beans. ? Carbonated drinks. ? Hard candy. ? Chewing gum. ? Sugar substitutes  See bloating handout below.   I am requesting recent lab work from your PCP.   We will see you back in 2 months. Call if concerns prior.   Aliene Altes, PA-C [facility]    Abdominal Bloating When you have abdominal bloating, your abdomen may feel full, tight, or painful. It may also look bigger than normal or swollen (distended). Common causes of abdominal bloating include:  Swallowing air.  Constipation.  Problems digesting food.  Eating too much.  Irritable bowel syndrome. This is a condition that affects the large intestine.  Lactose intolerance. This is an inability to digest lactose, a natural sugar in dairy products.  Celiac disease. This is a condition that affects the ability to digest gluten, a protein found in some grains.  Gastroparesis. This is a condition that slows down the movement of food in the stomach and small intestine. It is more common in people with diabetes mellitus.  Gastroesophageal reflux disease (GERD). This is a digestive condition that makes stomach acid flow back into the esophagus.  Urinary retention. This means that the body is holding onto urine, and the bladder cannot be emptied all the way. Follow these instructions at home: Eating and drinking  Avoid eating too much.  Try not to swallow air while talking or eating.  Avoid eating while lying down.  Avoid these foods and drinks: ? Foods  that cause gas, such as broccoli, cabbage, cauliflower, and baked beans. ? Carbonated drinks. ? Hard candy. ? Chewing gum. Medicines  Take over-the-counter and prescription medicines only as told by your health care provider.  Take probiotic medicines. These medicines contain live bacteria or yeasts that can help digestion.  Take coated peppermint oil capsules. Activity  Try to exercise regularly. Exercise may help to relieve bloating that is caused by gas and relieve constipation. General instructions  Keep all follow-up visits as told by your health care provider. This is important. Contact a health care provider if:  You have nausea and vomiting.  You have diarrhea.  You have abdominal pain.  You have unusual weight loss or weight gain.  You have severe pain, and medicines do not help. Get help right away if:  You have severe chest pain.  You have trouble breathing.  You have shortness of breath.  You have trouble urinating.  You have darker urine than normal.  You have blood in your stools or have dark, tarry stools. Summary  Abdominal bloating means that the abdomen is swollen.  Common causes of abdominal bloating are swallowing air, constipation, and problems digesting food.  Avoid eating too much and avoid swallowing air.  Avoid foods that cause gas, carbonated drinks, hard candy, and chewing gum. This information is not intended to replace advice given to you by your health care provider. Make sure you discuss any questions you have with your health care provider. Document Released: 07/13/2016 Document Revised: 09/29/2018 Document Reviewed: 07/13/2016 Elsevier Patient  Education  2020 Elsevier Inc.  

## 2019-04-06 ENCOUNTER — Telehealth: Payer: Self-pay | Admitting: *Deleted

## 2019-04-06 ENCOUNTER — Encounter: Payer: Self-pay | Admitting: Gastroenterology

## 2019-04-06 DIAGNOSIS — G4733 Obstructive sleep apnea (adult) (pediatric): Secondary | ICD-10-CM | POA: Diagnosis not present

## 2019-04-06 NOTE — Assessment & Plan Note (Addendum)
48 year old male with past medical history significant for GERD and diabetes who presents with chief complaint of abdominal bloating x6 months.  Started after changing his diet and starting new diabetes medications including metformin and glimepiride.  Also with decrease in bowel frequency.  Historically 3 BMs daily, now 1 daily with hard stools and straining at times.  Feels BMs are incomplete.  No alarm symptoms.  GERD symptoms are well controlled. Sparkling water maybe 2-3 a week. No milk. Cheese about 3 times a week. Eats yogurt a few times a week. Uses creamer and coffee.   Bloating may be secondary to decrease in bowel frequency and new addition of diabetes medications.  He may also have dietary intolerances.  Add probiotic- Philips colon health, align, or digestive advantage Start MiraLAX 1 capful (17g) daily. If if diarrhea develops, decrease frequency..  Drink plenty of water. Avoid these foods:  ? Foods that cause gas, such as broccoli, cabbage, cauliflower, and baked beans. ? Carbonated drinks. ? Hard candy. ? Chewing gum. ? Sugar substitutes Bloating handout provided. Requesting recent labs with PCP.  Per patient, labs 2 weeks ago. Follow-up in 2 months.  Call if questions or concerns prior.

## 2019-04-06 NOTE — Assessment & Plan Note (Signed)
Small firm mass in the right lower quadrant.  Nontender.  Does not reduce.  Suspect possible lipoma.  Will proceed with limited right lower quadrant abdominal ultrasound for further evaluation.

## 2019-04-06 NOTE — Assessment & Plan Note (Addendum)
Well-controlled on omeprazole 40 mg.  No alarm symptoms.  Continue omeprazole 40 mg daily.

## 2019-04-06 NOTE — Telephone Encounter (Signed)
[place] scheduled 10/15 Thursday at 8:30am, arrival 8:15am, npo midnight.   Patient aware of appt details.

## 2019-04-09 ENCOUNTER — Other Ambulatory Visit: Payer: Self-pay

## 2019-04-09 ENCOUNTER — Ambulatory Visit (HOSPITAL_COMMUNITY)
Admission: RE | Admit: 2019-04-09 | Discharge: 2019-04-09 | Disposition: A | Discharge status: Home / Self Care | Payer: Medicare HMO | Source: Ambulatory Visit | Attending: Gastroenterology | Admitting: Gastroenterology

## 2019-04-09 DIAGNOSIS — R1903 Right lower quadrant abdominal swelling, mass and lump: Secondary | ICD-10-CM | POA: Diagnosis not present

## 2019-04-09 DIAGNOSIS — K760 Fatty (change of) liver, not elsewhere classified: Secondary | ICD-10-CM | POA: Diagnosis not present

## 2019-04-09 NOTE — Progress Notes (Signed)
No hernia in RLQ. Just evidence of increased fatty deposition in the area. Nothing we need to do for this.   He does have fatty liver. See instructions below.  Instructions for fatty liver: Recommend 1-2# weight loss per week until ideal body weight through exercise & diet. Low fat/cholesterol diet.  Avoid sweets, sodas, fruit juices, sweetened beverages like tea, etc. Gradually increase exercise from 15 min daily up to 1 hr per day 5 days/week. Limit alcohol use.

## 2019-04-10 DIAGNOSIS — M546 Pain in thoracic spine: Secondary | ICD-10-CM | POA: Diagnosis not present

## 2019-04-10 DIAGNOSIS — M9903 Segmental and somatic dysfunction of lumbar region: Secondary | ICD-10-CM | POA: Diagnosis not present

## 2019-04-10 DIAGNOSIS — M5412 Radiculopathy, cervical region: Secondary | ICD-10-CM | POA: Diagnosis not present

## 2019-04-10 DIAGNOSIS — M9902 Segmental and somatic dysfunction of thoracic region: Secondary | ICD-10-CM | POA: Diagnosis not present

## 2019-04-10 DIAGNOSIS — M545 Low back pain: Secondary | ICD-10-CM | POA: Diagnosis not present

## 2019-04-10 DIAGNOSIS — M9901 Segmental and somatic dysfunction of cervical region: Secondary | ICD-10-CM | POA: Diagnosis not present

## 2019-04-24 DIAGNOSIS — M545 Low back pain: Secondary | ICD-10-CM | POA: Diagnosis not present

## 2019-04-24 DIAGNOSIS — M9901 Segmental and somatic dysfunction of cervical region: Secondary | ICD-10-CM | POA: Diagnosis not present

## 2019-04-24 DIAGNOSIS — M9902 Segmental and somatic dysfunction of thoracic region: Secondary | ICD-10-CM | POA: Diagnosis not present

## 2019-04-24 DIAGNOSIS — M546 Pain in thoracic spine: Secondary | ICD-10-CM | POA: Diagnosis not present

## 2019-04-24 DIAGNOSIS — M9903 Segmental and somatic dysfunction of lumbar region: Secondary | ICD-10-CM | POA: Diagnosis not present

## 2019-04-24 DIAGNOSIS — M5412 Radiculopathy, cervical region: Secondary | ICD-10-CM | POA: Diagnosis not present

## 2019-05-07 DIAGNOSIS — M546 Pain in thoracic spine: Secondary | ICD-10-CM | POA: Diagnosis not present

## 2019-05-07 DIAGNOSIS — M9901 Segmental and somatic dysfunction of cervical region: Secondary | ICD-10-CM | POA: Diagnosis not present

## 2019-05-07 DIAGNOSIS — M9902 Segmental and somatic dysfunction of thoracic region: Secondary | ICD-10-CM | POA: Diagnosis not present

## 2019-05-07 DIAGNOSIS — M5412 Radiculopathy, cervical region: Secondary | ICD-10-CM | POA: Diagnosis not present

## 2019-05-07 DIAGNOSIS — M545 Low back pain: Secondary | ICD-10-CM | POA: Diagnosis not present

## 2019-05-07 DIAGNOSIS — G4733 Obstructive sleep apnea (adult) (pediatric): Secondary | ICD-10-CM | POA: Diagnosis not present

## 2019-05-07 DIAGNOSIS — M9903 Segmental and somatic dysfunction of lumbar region: Secondary | ICD-10-CM | POA: Diagnosis not present

## 2019-05-14 DIAGNOSIS — G4733 Obstructive sleep apnea (adult) (pediatric): Secondary | ICD-10-CM | POA: Diagnosis not present

## 2019-05-23 ENCOUNTER — Telehealth: Payer: Self-pay | Admitting: Gastroenterology

## 2019-05-23 NOTE — Telephone Encounter (Signed)
Received and reviewed labs from PCP dated 11/11/2017.  CBC: WBC 8.5, hemoglobin 16.3, MCV 82, MCH 28.3, MCHC 34.6, platelets 210 CMP: Glucose 188, creatinine 1.0, sodium 137, potassium 4.4, chloride 101, calcium 9.7, albumin 4.5, total bilirubin 0.4, alk phos 80, AST 31, ALT 44 Hemoglobin A1c 7% (H) Lipid panel: Total cholesterol 213 (H), triglycerides 493 (H), HDL 29 (L), unable to calculate LDL due to high triglycerides PSA 0.6  No concerning findings on these labs. Patient stated he had labs completed with his PCP 2 weeks prior to his last appointment with [place] on 04/03/19.   Doris, could you reach out to patient to verify where his last labs were completed?  If possible, I would like to have these to review prior to his upcoming visit.

## 2019-05-25 DIAGNOSIS — E7849 Other hyperlipidemia: Secondary | ICD-10-CM | POA: Diagnosis not present

## 2019-05-25 DIAGNOSIS — E119 Type 2 diabetes mellitus without complications: Secondary | ICD-10-CM | POA: Diagnosis not present

## 2019-05-25 NOTE — Telephone Encounter (Signed)
Pt said his labs were done at [facility] either by Dr. Hilma Favors or Dr. Collene Mares. Manuela Schwartz, please request the labs.

## 2019-05-26 ENCOUNTER — Other Ambulatory Visit: Payer: Self-pay

## 2019-05-26 DIAGNOSIS — Z20822 Contact with and (suspected) exposure to covid-19: Secondary | ICD-10-CM

## 2019-05-26 NOTE — Telephone Encounter (Signed)
Requested records.

## 2019-05-27 DIAGNOSIS — U071 COVID-19: Secondary | ICD-10-CM | POA: Diagnosis not present

## 2019-05-28 DIAGNOSIS — H8109 Meniere's disease, unspecified ear: Secondary | ICD-10-CM | POA: Diagnosis not present

## 2019-05-28 LAB — NOVEL CORONAVIRUS, NAA: SARS-CoV-2, NAA: DETECTED — AB

## 2019-05-28 NOTE — Telephone Encounter (Signed)
Received records from PCP. Reported testosterone (normal) was the last lab completed with them on 02/28/19.   Will have lab scanned into system. We will wait for his upcoming follow-up appointment to determine if any labs are needed.

## 2019-05-30 ENCOUNTER — Telehealth: Payer: Self-pay | Admitting: Unknown Physician Specialty

## 2019-05-30 ENCOUNTER — Other Ambulatory Visit: Payer: Self-pay | Admitting: Unknown Physician Specialty

## 2019-05-30 DIAGNOSIS — U071 COVID-19: Secondary | ICD-10-CM

## 2019-05-30 NOTE — Telephone Encounter (Signed)
Scheduled for Tuesday 12/8 at 1PM

## 2019-05-30 NOTE — Telephone Encounter (Signed)
Pt with symptoms for 5 days or less. Decided to proceed with monoclonal ab infusion

## 2019-05-30 NOTE — Telephone Encounter (Signed)
Discussed with patient about Covid symptoms and the use of bamlanivimab, a monoclonal antibody infusion for those with mild to moderate Covid symptoms and at a high risk of hospitalization.  Pt is qualified for this infusion at the [facility] infusion center due to diabetes and hypertension which were addressed with the patient and are actively being managed by a [facility] provider.    Symptoms since last week of fever, chills, diarrhea and cough  After discussing the infusion's costs, potential benefits and side effects, the patient has decided to think about treatment with monoclonal antibodies.

## 2019-05-31 NOTE — Telephone Encounter (Signed)
Note entered in error

## 2019-06-02 ENCOUNTER — Ambulatory Visit (HOSPITAL_COMMUNITY)
Admission: RE | Admit: 2019-06-02 | Discharge: 2019-06-02 | Disposition: A | Discharge status: Home / Self Care | Payer: Medicare Other | Source: Ambulatory Visit | Attending: Pulmonary Disease | Admitting: Pulmonary Disease

## 2019-06-02 DIAGNOSIS — Z23 Encounter for immunization: Secondary | ICD-10-CM | POA: Diagnosis not present

## 2019-06-02 DIAGNOSIS — U071 COVID-19: Secondary | ICD-10-CM | POA: Insufficient documentation

## 2019-06-02 MED ORDER — EPINEPHRINE 0.3 MG/0.3ML IJ SOAJ: 0.3000 mg | Freq: Once | INTRAMUSCULAR | Status: DC | PRN

## 2019-06-02 MED ORDER — SODIUM CHLORIDE 0.9 % IV SOLN: INTRAVENOUS | Status: DC | PRN

## 2019-06-02 MED ORDER — FAMOTIDINE IN NACL 20-0.9 MG/50ML-% IV SOLN: 20.0000 mg | Freq: Once | INTRAVENOUS | Status: DC | PRN

## 2019-06-02 MED ORDER — DIPHENHYDRAMINE HCL 50 MG/ML IJ SOLN: 50.0000 mg | Freq: Once | INTRAMUSCULAR | Status: DC | PRN

## 2019-06-02 MED ORDER — METHYLPREDNISOLONE SODIUM SUCC 125 MG IJ SOLR: 125.0000 mg | Freq: Once | INTRAMUSCULAR | Status: DC | PRN

## 2019-06-02 MED ORDER — ALBUTEROL SULFATE HFA 108 (90 BASE) MCG/ACT IN AERS: 2.0000 | INHALATION_SPRAY | Freq: Once | RESPIRATORY_TRACT | Status: DC | PRN

## 2019-06-02 MED ORDER — SODIUM CHLORIDE 0.9 % IV SOLN
700.0000 mg | Freq: Once | INTRAVENOUS | Status: AC
  Administered 2019-06-02: 700 mg via INTRAVENOUS
  Filled 2019-06-02: qty 20

## 2019-06-02 NOTE — Progress Notes (Signed)
.   Diagnosis: COVID-19  Physician: Dr. Wright  Procedure: Covid Infusion Clinic Med: casirivimab\imdevimab infusion - Provided patient with casirivimab\imdevimab fact sheet for patients, parents and caregivers prior to infusion.  Complications: No immediate complications noted.  Discharge: Discharged home   Edwin Norton 06/02/2019  

## 2019-06-06 DIAGNOSIS — G4733 Obstructive sleep apnea (adult) (pediatric): Secondary | ICD-10-CM | POA: Diagnosis not present

## 2019-06-08 ENCOUNTER — Ambulatory Visit: Payer: Medicare HMO | Admitting: Gastroenterology

## 2019-06-24 ENCOUNTER — Ambulatory Visit (INDEPENDENT_AMBULATORY_CARE_PROVIDER_SITE_OTHER): Payer: Medicare HMO | Admitting: Gastroenterology

## 2019-06-24 ENCOUNTER — Encounter: Payer: Self-pay | Admitting: Gastroenterology

## 2019-06-24 ENCOUNTER — Other Ambulatory Visit: Payer: Self-pay

## 2019-06-24 DIAGNOSIS — K760 Fatty (change of) liver, not elsewhere classified: Secondary | ICD-10-CM | POA: Diagnosis not present

## 2019-06-24 DIAGNOSIS — R1903 Right lower quadrant abdominal swelling, mass and lump: Secondary | ICD-10-CM

## 2019-06-24 DIAGNOSIS — R140 Abdominal distension (gaseous): Secondary | ICD-10-CM | POA: Diagnosis not present

## 2019-06-24 DIAGNOSIS — K219 Gastro-esophageal reflux disease without esophagitis: Secondary | ICD-10-CM | POA: Diagnosis not present

## 2019-06-24 NOTE — Assessment & Plan Note (Signed)
Fatty liver noted on ultrasound on 04/09/2019.  He does have risk factors for NAFLD including BMI over 30, uncontrolled diabetes, and hyperlipidemia.  No recent labs on file.  He is without any signs or symptoms of advanced liver disease.  No alcohol use.  He has been working on weight loss through diet and trying to increase his exercise due to diabetes.  Per patient, he waits about 235 lbs which is about a 15 pound weight difference since October 2020.   At this time, will update labs. Recommend 1-2# weight loss per week until ideal body weight through exercise & diet. Low fat/cholesterol diet.   Avoid sweets, sodas, fruit juices, sweetened beverages like tea, etc. Gradually increase exercise from 15 min daily up to 1 hr per day 5 days/week. Limit alcohol use. Plan to follow-up in 6 months.

## 2019-06-24 NOTE — Assessment & Plan Note (Signed)
Well-controlled on omeprazole 40 mg daily.  No alarm symptoms.  Continue omeprazole 40 mg daily. Follow-up in 6 months.

## 2019-06-24 NOTE — Patient Instructions (Addendum)
I am glad you are feeling better!   Please have labs completed.  As we discussed, your ultrasound did show you have a fatty liver.  We are checking labs to ensure your liver numbers are normal. Treatment of fatty liver includes weight loss through diet and exercise as well as good control of diabetes and cholesterol. Recommend 1-2# weight loss per week until ideal body weight through exercise & diet. Low fat/cholesterol diet.   Avoid sweets, sodas, fruit juices, sweetened beverages like tea, etc. Gradually increase exercise from 15 min daily up to 1 hr per day 5 days/week. Limit alcohol use.  Continue to avoid frequent intake of broccoli and cheese as this has significantly improved your bloating and constipation.  Continue daily probiotic.  Continue Prilosec 40 mg daily.  We will follow-up with you in 6 months unless your labs indicate we need to see you sooner. Do not hesitate to call if you have questions or concerns prior.   Aliene Altes, PA-C [facility]

## 2019-06-24 NOTE — Assessment & Plan Note (Signed)
Much improved/resolved after discontinuing regular broccoli and cheese consumption.  BMs have also returned to normal with 3-4 soft/formed BMs daily which is his baseline.  He has also added a daily probiotic.  Continue to avoid foods that cause gas and bloating, specifically broccoli and cheese. Continue daily probiotic. Monitor for any worsening of symptoms. Follow-up in 6 months.

## 2019-06-24 NOTE — Progress Notes (Signed)
Primary Care Physician:  Assunta FoundGolding, John, MD  Primary GI: Dr. Darrick PennaFields  Patient Location: Home   [facility]   Reason for Visit: Follow-up abdominal bloating, GERD    Persons present on the virtual encounter, with roles:    Total time (minutes) spent on medical discussion: ### minutes   Due to COVID-19, visit was conducted using virtual method.  Visit was requested by patient.  Virtual Visit via Telephone Note Due to COVID-19, visit is conducted virtually and was requested by patient.   I connected with Edwin Norton on 06/24/19 at  3:30 PM EST by video and verified that I am speaking with the correct person using two identifiers.   I discussed the limitations, risks, security and privacy concerns of performing an evaluation and management service by video and the availability of in person appointments. I also discussed with the patient that there may be a patient responsible charge related to this service. The patient expressed understanding and agreed to proceed.  Chief Complaint  Patient presents with  . Gastroesophageal Reflux    f/u   History of Present Illness: Edwin BenderJames Norton is a 48 y.o. male presenting today for follow-up. Last seen on 04/03/19 for abdominal bloating, GERD. Small firm mass noted in RLQ on exam. Abdominal bloating present x 6 months and started after changing diet and starting new DM medications including glimepiride and metformin. Also with decrease in bowel frequency and sensation of incompleteness. GERD well controlled on PPI daily. Suspected Bloating may be secondary to decrease in bowel frequency and new addition of diabetes medications.  He may also have dietary intolerances. Advised to add probiotic, start MiraLAX 1 capful daily, increase water intake, avoid foods that cause gas/bloating, limited US to evaluate RLQ mass, and f/u in 2 months.   US revealed increased fatty deposition in the area of concern. No hernia. Did show fatty liver and  recommendations were given regarding this.   Today: Bloating: Much improved. He has added a daily probiotic (Bio Salud drink). Hasn't added  MiraLAX. Having 3-4 BMs daily. Stools are soft and formed. This is back to baseline. No blood in the stool or black stools. No abdominal pain. Quit eating sugar. Has eliminated broccoli.  States he had been told by his primary care to eat fish and broccoli, so he had been eating broccoli with cheese daily.  Now that he has stopped eating broccoli with cheese, bloating has improved and constipation has improved.  GERD: No breakthrough symptoms. Well controlled on Prilosec 40 mg daily. No dysphagia. No nausea or vomiting.   Had coronavirus. Feeling better. Had a cough and fever a couple of day. Lost a few lbs because he didn't eat much for a few days. Thinks he weighs 235 lbs.   Fatty Liver: Getting more exercise than he used to. 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 Trying to get in more walking. Last A1C 9.7 in October. States his current glucose readings are around 99-110. No swelling in legs or abdomen. No yellowing of eyes or skin. No dark urine. No confusion. No easy bruising. No alcohol.   Past Medical History:  Diagnosis Date  . Anxiety   . Complication of anesthesia 2005   does not recall name of meds. but makes his ears ring  . Depression   . Diabetes (HCC)   . Dizziness   . GERD (gastroesophageal reflux disease)   . History of nephrolithiasis   . HLD (hyperlipidemia)   . Meniere disease   .  Shortness of breath      Past Surgical History:  Procedure Laterality Date  . ANAL FISSURE REPAIR  Sept 2001   Dr. Theodis Sato, [facility]  . ANAL FISSURE REPAIR  11/14/2011   Procedure: ANAL FISSURE REPAIR;  Surgeon: Dalia Heading, MD;  Location: [facility];  Service: General;  Laterality: N/A;  Excision Skin Tag of Anus  . BRONCHOSCOPY  08/23/06   erythema was found in the left lower lobe  . 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 ESOPHAGOGASTRODUODENOSCOPY  04/15/07   patchy erythema in antrum, negative H.pylori   . EXTERNAL EAR SURGERY     right  . HEMORROIDECTOMY       Current Meds  Medication Sig  . ALPRAZolam (XANAX) 1 MG tablet Take 0.5-1 tablets by mouth 2 (two) times daily.   Marland Kitchen atorvastatin (LIPITOR) 40 MG tablet Take 40 mg by mouth at bedtime.  . Coenzyme Q10 (COQ-10 PO) Take by mouth at bedtime.  Marland Kitchen glimepiride (AMARYL) 2 MG tablet Take 2 mg by mouth at bedtime.  Boris Lown Oil 500 MG CAPS Take by mouth at bedtime.  . metFORMIN (GLUCOPHAGE) 500 MG tablet Take by mouth 2 (two) times daily.  . Multiple Vitamins-Minerals (ICAPS PO) Take by mouth daily.  Marland Kitchen omeprazole (PRILOSEC) 40 MG capsule Take 40 mg by mouth at bedtime.     Family History  Problem Relation Age of Onset  . Diabetes Other   . Colon polyps Mother   . Colon cancer Neg Hx     Social History   Socioeconomic History  . Marital status: Single    Spouse name: Not on file  . Number of children: Not on file  . Years of education: 8  . Highest education level: Not on file  Occupational History    Employer: NOT EMPLOYED  Tobacco Use  . Smoking status: Former Smoker    Types: Cigarettes  . Smokeless tobacco: Current User    Types: Snuff  Substance and Sexual Activity  . Alcohol use: No  . Drug use: No  . Sexual activity: Not on file  Other Topics Concern  . Not on file  Social History Narrative  . Not on file   Social Determinants of Health   Financial Resource Strain:   . Difficulty of Paying Living Expenses: Not on file  Food Insecurity:   . Worried About Programme researcher, broadcasting/film/video in the Last Year: Not on file  . Ran Out of Food in the Last Year: Not on file  Transportation Needs:   . Lack of Transportation (Medical): Not on file  . Lack of Transportation (Non-Medical): Not on file  Physical Activity:   . Days of Exercise  per Week: Not on file  . Minutes of Exercise per Session: Not on file  Stress:   . Feeling of Stress : Not on file  Social Connections:   . Frequency of Communication with Friends and Family: Not on file  . Frequency of Social Gatherings with Friends and Family: Not on file  . Attends Religious Services: Not on file  . Active Member of Clubs or Organizations: Not on file  . Attends Banker Meetings: Not on file  . Marital Status: Not on file     Review of Systems: Gen: Denies fever, chills. Chronic intermittent dizziness due to meniere's disease.  CV: Denies chest  pain or palpitations Resp: Denies dyspnea. Occasional cough with weather changes.   GI: see HPI Derm: Denies rash Psych: Denies depression. Admits to anxiety. Heme: Denies bruising or bleeding  Observations/Objective: No distress. Dressed appropriately. Sclera appear clear without icterus. No jaundice appreciated.  Unable to perform physical exam due to video encounter.  Assessment and Plan: 48 y.o. male with past medical history as per above presenting for follow-up of abdominal bloating, fatty liver, GERD, and RLQ mass.   Fatty Liver: Fatty liver noted on ultrasound on 04/09/2019.  He does have risk factors for NAFLD including BMI over 30, uncontrolled diabetes, and hyperlipidemia. No recent labs on file.  He is without any signs or symptoms of advanced liver disease.  No alcohol use.  He has been working on weight loss through diet and trying to increase his exercise due to diabetes.  Per patient, he waits about 235 lbs which is about a 15 pound weight difference since October 2020.   At this time, will update labs (CBC and BMP). Recommend 1-2# weight loss per week until ideal body weight through exercise & diet. Low fat/cholesterol diet.   Avoid sweets, sodas, fruit juices, sweetened beverages like tea, etc. Gradually increase exercise from 15 min daily up to 1 hr per day 5 days/week. Limit alcohol use.  Plan to follow-up in 6 months.  Bloating: Much improved/resolved after discontinuing regular broccoli and cheese consumption.  BMs have also returned to normal with 3-4 soft/formed BMs daily which is his baseline.  He has also added a daily probiotic.  Continue to avoid foods that cause gas and bloating, specifically broccoli and cheese. Continue daily probiotic. Monitor for any worsening of symptoms. Follow-up in 6 months.  GERD: Well-controlled on omeprazole 40 mg daily.  No alarm symptoms.  Continue omeprazole 40 mg daily. Follow-up in 6 months.  RLQ abdominal mass: Ultrasound revealed increased fatty deposition in the area of concern.  No hernia.  Suspect this is likely a lipoma.  Patient has no symptoms related to this.  No further evaluation needed.  Follow Up Instructions: Follow-up in 6 months.    I discussed the assessment and treatment plan with the patient. The patient was provided an opportunity to ask questions and all were answered. The patient agreed with the plan and demonstrated an understanding of the instructions.   The patient was advised to call back or seek an in-person evaluation if the symptoms worsen or if the condition fails to improve as anticipated.  I provided 19 minutes of video-face-to-face time during this encounter.  Aliene Altes, PA-C [facility]

## 2019-06-24 NOTE — Assessment & Plan Note (Signed)
Ultrasound revealed increased fatty deposition in the area of concern.  No hernia.  Suspect this is likely a lipoma.  Patient has no symptoms related to this.  No further evaluation needed.

## 2019-06-25 ENCOUNTER — Encounter: Payer: Self-pay | Admitting: Gastroenterology

## 2019-06-25 NOTE — Progress Notes (Signed)
CC'ED TO PCP 

## 2019-07-01 DIAGNOSIS — G4733 Obstructive sleep apnea (adult) (pediatric): Secondary | ICD-10-CM | POA: Diagnosis not present

## 2019-07-07 DIAGNOSIS — G4733 Obstructive sleep apnea (adult) (pediatric): Secondary | ICD-10-CM | POA: Diagnosis not present

## 2019-07-09 DIAGNOSIS — K760 Fatty (change of) liver, not elsewhere classified: Secondary | ICD-10-CM | POA: Diagnosis not present

## 2019-07-09 LAB — CBC WITH DIFFERENTIAL/PLATELET
Absolute Monocytes: 486 cells/uL (ref 200–950)
Basophils Absolute: 18 cells/uL (ref 0–200)
Basophils Relative: 0.3 %
Eosinophils Absolute: 102 cells/uL (ref 15–500)
Eosinophils Relative: 1.7 %
HCT: 42.8 % (ref 38.5–50.0)
Hemoglobin: 14.4 g/dL (ref 13.2–17.1)
Lymphs Abs: 3240 cells/uL (ref 850–3900)
MCH: 28.6 pg (ref 27.0–33.0)
MCHC: 33.6 g/dL (ref 32.0–36.0)
MCV: 84.9 fL (ref 80.0–100.0)
MPV: 10.4 fL (ref 7.5–12.5)
Monocytes Relative: 8.1 %
Neutro Abs: 2154 cells/uL (ref 1500–7800)
Neutrophils Relative %: 35.9 %
Platelets: 235 10*3/uL (ref 140–400)
RBC: 5.04 10*6/uL (ref 4.20–5.80)
RDW: 14.2 % (ref 11.0–15.0)
Total Lymphocyte: 54 %
WBC: 6 10*3/uL (ref 3.8–10.8)

## 2019-07-09 LAB — COMPLETE METABOLIC PANEL WITH GFR
AG Ratio: 1.8 (calc) (ref 1.0–2.5)
ALT: 23 U/L (ref 9–46)
AST: 15 U/L (ref 10–40)
Albumin: 4 g/dL (ref 3.6–5.1)
Alkaline phosphatase (APISO): 55 U/L (ref 36–130)
BUN: 14 mg/dL (ref 7–25)
CO2: 23 mmol/L (ref 20–32)
Calcium: 9 mg/dL (ref 8.6–10.3)
Chloride: 108 mmol/L (ref 98–110)
Creat: 0.87 mg/dL (ref 0.60–1.35)
GFR, Est African American: 118 mL/min/{1.73_m2} (ref >=60)
GFR, Est Non African American: 102 mL/min/{1.73_m2} (ref >=60)
Globulin: 2.2 g/dL (calc) (ref 1.9–3.7)
Glucose, Bld: 239 mg/dL — ABNORMAL HIGH (ref 65–99)
Potassium: 3.6 mmol/L (ref 3.5–5.3)
Sodium: 139 mmol/L (ref 135–146)
Total Bilirubin: 0.3 mg/dL (ref 0.2–1.2)
Total Protein: 6.2 g/dL (ref 6.1–8.1)

## 2019-07-09 NOTE — Progress Notes (Signed)
CBC with normal hemoglobin, platelets, and WBC. CMP with normal electrolytes, kidney function, and liver function tests. His glucose was quite elevated at 239. He has history of diabetes and needs to follow-up with his PCP on this. Not sure if these labs were completed fasting or not.

## 2019-07-26 DIAGNOSIS — G894 Chronic pain syndrome: Secondary | ICD-10-CM | POA: Diagnosis not present

## 2019-07-26 DIAGNOSIS — I10 Essential (primary) hypertension: Secondary | ICD-10-CM | POA: Diagnosis not present

## 2019-07-26 DIAGNOSIS — F330 Major depressive disorder, recurrent, mild: Secondary | ICD-10-CM | POA: Diagnosis not present

## 2019-07-26 DIAGNOSIS — F4542 Pain disorder with related psychological factors: Secondary | ICD-10-CM | POA: Diagnosis not present

## 2019-08-07 DIAGNOSIS — G4733 Obstructive sleep apnea (adult) (pediatric): Secondary | ICD-10-CM | POA: Diagnosis not present

## 2019-08-12 DIAGNOSIS — G4733 Obstructive sleep apnea (adult) (pediatric): Secondary | ICD-10-CM | POA: Diagnosis not present

## 2019-08-14 DIAGNOSIS — M9903 Segmental and somatic dysfunction of lumbar region: Secondary | ICD-10-CM | POA: Diagnosis not present

## 2019-08-14 DIAGNOSIS — M546 Pain in thoracic spine: Secondary | ICD-10-CM | POA: Diagnosis not present

## 2019-08-14 DIAGNOSIS — M9901 Segmental and somatic dysfunction of cervical region: Secondary | ICD-10-CM | POA: Diagnosis not present

## 2019-08-14 DIAGNOSIS — M9902 Segmental and somatic dysfunction of thoracic region: Secondary | ICD-10-CM | POA: Diagnosis not present

## 2019-08-14 DIAGNOSIS — M542 Cervicalgia: Secondary | ICD-10-CM | POA: Diagnosis not present

## 2019-08-14 DIAGNOSIS — M545 Low back pain: Secondary | ICD-10-CM | POA: Diagnosis not present

## 2019-08-19 DIAGNOSIS — M9902 Segmental and somatic dysfunction of thoracic region: Secondary | ICD-10-CM | POA: Diagnosis not present

## 2019-08-19 DIAGNOSIS — M9901 Segmental and somatic dysfunction of cervical region: Secondary | ICD-10-CM | POA: Diagnosis not present

## 2019-08-19 DIAGNOSIS — M545 Low back pain: Secondary | ICD-10-CM | POA: Diagnosis not present

## 2019-08-19 DIAGNOSIS — M542 Cervicalgia: Secondary | ICD-10-CM | POA: Diagnosis not present

## 2019-08-19 DIAGNOSIS — M9903 Segmental and somatic dysfunction of lumbar region: Secondary | ICD-10-CM | POA: Diagnosis not present

## 2019-08-19 DIAGNOSIS — M546 Pain in thoracic spine: Secondary | ICD-10-CM | POA: Diagnosis not present

## 2019-08-26 DIAGNOSIS — M546 Pain in thoracic spine: Secondary | ICD-10-CM | POA: Diagnosis not present

## 2019-08-26 DIAGNOSIS — M9903 Segmental and somatic dysfunction of lumbar region: Secondary | ICD-10-CM | POA: Diagnosis not present

## 2019-08-26 DIAGNOSIS — M9902 Segmental and somatic dysfunction of thoracic region: Secondary | ICD-10-CM | POA: Diagnosis not present

## 2019-08-26 DIAGNOSIS — M9901 Segmental and somatic dysfunction of cervical region: Secondary | ICD-10-CM | POA: Diagnosis not present

## 2019-08-26 DIAGNOSIS — M542 Cervicalgia: Secondary | ICD-10-CM | POA: Diagnosis not present

## 2019-08-26 DIAGNOSIS — M545 Low back pain: Secondary | ICD-10-CM | POA: Diagnosis not present

## 2019-08-28 DIAGNOSIS — B029 Zoster without complications: Secondary | ICD-10-CM | POA: Diagnosis not present

## 2019-09-02 DIAGNOSIS — M9902 Segmental and somatic dysfunction of thoracic region: Secondary | ICD-10-CM | POA: Diagnosis not present

## 2019-09-02 DIAGNOSIS — M9901 Segmental and somatic dysfunction of cervical region: Secondary | ICD-10-CM | POA: Diagnosis not present

## 2019-09-02 DIAGNOSIS — M9903 Segmental and somatic dysfunction of lumbar region: Secondary | ICD-10-CM | POA: Diagnosis not present

## 2019-09-02 DIAGNOSIS — M545 Low back pain: Secondary | ICD-10-CM | POA: Diagnosis not present

## 2019-09-02 DIAGNOSIS — M546 Pain in thoracic spine: Secondary | ICD-10-CM | POA: Diagnosis not present

## 2019-09-02 DIAGNOSIS — M542 Cervicalgia: Secondary | ICD-10-CM | POA: Diagnosis not present

## 2019-09-04 DIAGNOSIS — G4733 Obstructive sleep apnea (adult) (pediatric): Secondary | ICD-10-CM | POA: Diagnosis not present

## 2019-09-14 DIAGNOSIS — M9902 Segmental and somatic dysfunction of thoracic region: Secondary | ICD-10-CM | POA: Diagnosis not present

## 2019-09-14 DIAGNOSIS — M9901 Segmental and somatic dysfunction of cervical region: Secondary | ICD-10-CM | POA: Diagnosis not present

## 2019-09-14 DIAGNOSIS — M542 Cervicalgia: Secondary | ICD-10-CM | POA: Diagnosis not present

## 2019-09-14 DIAGNOSIS — M546 Pain in thoracic spine: Secondary | ICD-10-CM | POA: Diagnosis not present

## 2019-09-14 DIAGNOSIS — M9903 Segmental and somatic dysfunction of lumbar region: Secondary | ICD-10-CM | POA: Diagnosis not present

## 2019-09-14 DIAGNOSIS — M545 Low back pain: Secondary | ICD-10-CM | POA: Diagnosis not present

## 2019-09-29 DIAGNOSIS — G4733 Obstructive sleep apnea (adult) (pediatric): Secondary | ICD-10-CM | POA: Diagnosis not present

## 2019-09-30 DIAGNOSIS — M9902 Segmental and somatic dysfunction of thoracic region: Secondary | ICD-10-CM | POA: Diagnosis not present

## 2019-09-30 DIAGNOSIS — M9903 Segmental and somatic dysfunction of lumbar region: Secondary | ICD-10-CM | POA: Diagnosis not present

## 2019-09-30 DIAGNOSIS — M9901 Segmental and somatic dysfunction of cervical region: Secondary | ICD-10-CM | POA: Diagnosis not present

## 2019-09-30 DIAGNOSIS — M546 Pain in thoracic spine: Secondary | ICD-10-CM | POA: Diagnosis not present

## 2019-09-30 DIAGNOSIS — M542 Cervicalgia: Secondary | ICD-10-CM | POA: Diagnosis not present

## 2019-09-30 DIAGNOSIS — M545 Low back pain: Secondary | ICD-10-CM | POA: Diagnosis not present

## 2019-10-09 DIAGNOSIS — M9901 Segmental and somatic dysfunction of cervical region: Secondary | ICD-10-CM | POA: Diagnosis not present

## 2019-10-09 DIAGNOSIS — M546 Pain in thoracic spine: Secondary | ICD-10-CM | POA: Diagnosis not present

## 2019-10-09 DIAGNOSIS — M9902 Segmental and somatic dysfunction of thoracic region: Secondary | ICD-10-CM | POA: Diagnosis not present

## 2019-10-09 DIAGNOSIS — M9903 Segmental and somatic dysfunction of lumbar region: Secondary | ICD-10-CM | POA: Diagnosis not present

## 2019-10-09 DIAGNOSIS — M542 Cervicalgia: Secondary | ICD-10-CM | POA: Diagnosis not present

## 2019-10-09 DIAGNOSIS — M545 Low back pain: Secondary | ICD-10-CM | POA: Diagnosis not present

## 2019-10-12 DIAGNOSIS — M542 Cervicalgia: Secondary | ICD-10-CM | POA: Diagnosis not present

## 2019-10-12 DIAGNOSIS — M9901 Segmental and somatic dysfunction of cervical region: Secondary | ICD-10-CM | POA: Diagnosis not present

## 2019-10-12 DIAGNOSIS — M546 Pain in thoracic spine: Secondary | ICD-10-CM | POA: Diagnosis not present

## 2019-10-12 DIAGNOSIS — M545 Low back pain: Secondary | ICD-10-CM | POA: Diagnosis not present

## 2019-10-12 DIAGNOSIS — M9903 Segmental and somatic dysfunction of lumbar region: Secondary | ICD-10-CM | POA: Diagnosis not present

## 2019-10-12 DIAGNOSIS — M9902 Segmental and somatic dysfunction of thoracic region: Secondary | ICD-10-CM | POA: Diagnosis not present

## 2019-10-21 DIAGNOSIS — M9902 Segmental and somatic dysfunction of thoracic region: Secondary | ICD-10-CM | POA: Diagnosis not present

## 2019-10-21 DIAGNOSIS — M9903 Segmental and somatic dysfunction of lumbar region: Secondary | ICD-10-CM | POA: Diagnosis not present

## 2019-10-21 DIAGNOSIS — M542 Cervicalgia: Secondary | ICD-10-CM | POA: Diagnosis not present

## 2019-10-21 DIAGNOSIS — M9901 Segmental and somatic dysfunction of cervical region: Secondary | ICD-10-CM | POA: Diagnosis not present

## 2019-10-21 DIAGNOSIS — M545 Low back pain: Secondary | ICD-10-CM | POA: Diagnosis not present

## 2019-10-21 DIAGNOSIS — M546 Pain in thoracic spine: Secondary | ICD-10-CM | POA: Diagnosis not present

## 2019-11-04 DIAGNOSIS — H524 Presbyopia: Secondary | ICD-10-CM | POA: Diagnosis not present

## 2019-11-09 DIAGNOSIS — M545 Low back pain: Secondary | ICD-10-CM | POA: Diagnosis not present

## 2019-11-09 DIAGNOSIS — M546 Pain in thoracic spine: Secondary | ICD-10-CM | POA: Diagnosis not present

## 2019-11-09 DIAGNOSIS — M9902 Segmental and somatic dysfunction of thoracic region: Secondary | ICD-10-CM | POA: Diagnosis not present

## 2019-11-09 DIAGNOSIS — M542 Cervicalgia: Secondary | ICD-10-CM | POA: Diagnosis not present

## 2019-11-09 DIAGNOSIS — M9901 Segmental and somatic dysfunction of cervical region: Secondary | ICD-10-CM | POA: Diagnosis not present

## 2019-11-09 DIAGNOSIS — M9903 Segmental and somatic dysfunction of lumbar region: Secondary | ICD-10-CM | POA: Diagnosis not present

## 2019-11-11 DIAGNOSIS — G4733 Obstructive sleep apnea (adult) (pediatric): Secondary | ICD-10-CM | POA: Diagnosis not present

## 2019-11-18 DIAGNOSIS — M546 Pain in thoracic spine: Secondary | ICD-10-CM | POA: Diagnosis not present

## 2019-11-18 DIAGNOSIS — M542 Cervicalgia: Secondary | ICD-10-CM | POA: Diagnosis not present

## 2019-11-18 DIAGNOSIS — M9902 Segmental and somatic dysfunction of thoracic region: Secondary | ICD-10-CM | POA: Diagnosis not present

## 2019-11-18 DIAGNOSIS — M545 Low back pain: Secondary | ICD-10-CM | POA: Diagnosis not present

## 2019-11-18 DIAGNOSIS — M9901 Segmental and somatic dysfunction of cervical region: Secondary | ICD-10-CM | POA: Diagnosis not present

## 2019-11-18 DIAGNOSIS — M9903 Segmental and somatic dysfunction of lumbar region: Secondary | ICD-10-CM | POA: Diagnosis not present

## 2019-12-02 DIAGNOSIS — M542 Cervicalgia: Secondary | ICD-10-CM | POA: Diagnosis not present

## 2019-12-02 DIAGNOSIS — M9901 Segmental and somatic dysfunction of cervical region: Secondary | ICD-10-CM | POA: Diagnosis not present

## 2019-12-02 DIAGNOSIS — M545 Low back pain: Secondary | ICD-10-CM | POA: Diagnosis not present

## 2019-12-02 DIAGNOSIS — M9903 Segmental and somatic dysfunction of lumbar region: Secondary | ICD-10-CM | POA: Diagnosis not present

## 2019-12-02 DIAGNOSIS — M9902 Segmental and somatic dysfunction of thoracic region: Secondary | ICD-10-CM | POA: Diagnosis not present

## 2019-12-02 DIAGNOSIS — M546 Pain in thoracic spine: Secondary | ICD-10-CM | POA: Diagnosis not present

## 2019-12-11 DIAGNOSIS — M9903 Segmental and somatic dysfunction of lumbar region: Secondary | ICD-10-CM | POA: Diagnosis not present

## 2019-12-11 DIAGNOSIS — M9902 Segmental and somatic dysfunction of thoracic region: Secondary | ICD-10-CM | POA: Diagnosis not present

## 2019-12-11 DIAGNOSIS — M542 Cervicalgia: Secondary | ICD-10-CM | POA: Diagnosis not present

## 2019-12-11 DIAGNOSIS — M545 Low back pain: Secondary | ICD-10-CM | POA: Diagnosis not present

## 2019-12-11 DIAGNOSIS — M9901 Segmental and somatic dysfunction of cervical region: Secondary | ICD-10-CM | POA: Diagnosis not present

## 2019-12-11 DIAGNOSIS — M546 Pain in thoracic spine: Secondary | ICD-10-CM | POA: Diagnosis not present

## 2019-12-20 NOTE — Progress Notes (Deleted)
Referring Provider: Sharilyn Sites, MD Primary Care Physician:  Sharilyn Sites, MD Primary GI Physician: Dr. Oneida Alar (Dr. Gala Romney following for now)  No chief complaint on file.   HPI:   Edwin Norton is a 49 y.o. male presenting today with a GI history of fatty liver, GERD, and bloating.  Presents today for follow-up.    Last seen via virtual visit in December 2020.  Bloating was much improved with daily probiotic, limiting sugar, broccoli, and cheese.  GERD well controlled on omeprazole 40 mg daily.  Regarding fatty liver, hemoglobin A1c in October was 9.7.  Patient reported he was trying to get more exercise and eat healthier with glucose readings at home around 99-110.  No signs or symptoms of decompensated/advanced liver disease.  Plans to update CBC and CMP as we had no labs on file, recommendations regarding weight loss/fatty liver were given.  Otherwise, patient was to continue daily probiotic, avoidance of items that cause gas/bloating, and continue omeprazole.   Labs completed 07/09/19: CBC with normal hemoglobin, platelets, and WBC. CMP with normal electrolytes, kidney function, and liver function tests. His glucose was quite elevated at 239.  Advise follow-up with PCP for diabetes.  Today:  GERD:   Bloating:   Fatty Liver:  Weight:  A1C:     Past Medical History:  Diagnosis Date  . Anxiety   . Complication of anesthesia 2005   does not recall name of meds. but makes his ears ring  . Depression   . Diabetes ([place])   . Dizziness   . GERD (gastroesophageal reflux disease)   . History of nephrolithiasis   . HLD (hyperlipidemia)   . Meniere disease   . Shortness of breath     Past Surgical History:  Procedure Laterality Date  . ANAL FISSURE REPAIR  Sept 2001   Dr. Carolan Shiver, [place]  11/14/2011   Procedure: ANAL FISSURE REPAIR;  Surgeon: Jamesetta So, MD;  Location: [facility];  Service: General;  Laterality: N/A;  Excision Skin  Tag of Anus  . BRONCHOSCOPY  08/23/06   erythema was found in the left lower lobe  . debridment of chronic granulation tissue and seton placement  Feb 2002   Dr. Carolan Shiver, [facility], noted suprasphincteric fistula, with seton placement   . ESOPHAGOGASTRODUODENOSCOPY  04/15/07   patchy erythema in antrum, negative H.pylori   . EXTERNAL EAR SURGERY     right  . HEMORROIDECTOMY      Current Outpatient Medications  Medication Sig Dispense Refill  . ALPRAZolam (XANAX) 1 MG tablet Take 0.5-1 tablets by mouth 2 (two) times daily.     Marland Kitchen atorvastatin (LIPITOR) 40 MG tablet Take 40 mg by mouth at bedtime.    . Coenzyme Q10 (COQ-10 PO) Take by mouth at bedtime.    Marland Kitchen glimepiride (AMARYL) 2 MG tablet Take 2 mg by mouth at bedtime.    Javier Docker Oil 500 MG CAPS Take by mouth at bedtime.    . metFORMIN (GLUCOPHAGE) 500 MG tablet Take by mouth 2 (two) times daily.    . Multiple Vitamins-Minerals (ICAPS PO) Take by mouth daily.    Marland Kitchen omeprazole (PRILOSEC) 40 MG capsule Take 40 mg by mouth at bedtime.     No current facility-administered medications for this visit.    Allergies as of 12/21/2019 - Review Complete 06/24/2019  Allergen Reaction Noted  . Morphine and related  11/29/2016  . Percocet [oxycodone-acetaminophen] Itching 05/13/2011  .  Vicodin [hydrocodone-acetaminophen] Itching 05/13/2011    Family History  Problem Relation Age of Onset  . Diabetes Other   . Colon polyps Mother   . Colon cancer Neg Hx     Social History   Socioeconomic History  . Marital status: Single    Spouse name: Not on file  . Number of children: Not on file  . Years of education: 8  . Highest education level: Not on file  Occupational History    Employer: NOT EMPLOYED  Tobacco Use  . Smoking status: Former Smoker    Types: Cigarettes  . Smokeless tobacco: Current User    Types: Snuff  Substance and Sexual Activity  . Alcohol use: No  . Drug use: No  . Sexual activity: Not on file  Other  Topics Concern  . Not on file  Social History Narrative  . Not on file   Social Determinants of Health   Financial Resource Strain:   . Difficulty of Paying Living Expenses:   Food Insecurity:   . Worried About Programme researcher, broadcasting/film/video in the Last Year:   . Barista in the Last Year:   Transportation Needs:   . Freight forwarder (Medical):   Marland Kitchen Lack of Transportation (Non-Medical):   Physical Activity:   . Days of Exercise per Week:   . Minutes of Exercise per Session:   Stress:   . Feeling of Stress :   Social Connections:   . Frequency of Communication with Friends and Family:   . Frequency of Social Gatherings with Friends and Family:   . Attends Religious Services:   . Active Member of Clubs or Organizations:   . Attends Banker Meetings:   Marland Kitchen Marital Status:     Review of Systems: Gen: Denies fever, chills, anorexia. Denies fatigue, weakness, weight loss.  CV: Denies chest pain, palpitations, syncope, peripheral edema, and claudication. Resp: Denies dyspnea at rest, cough, wheezing, coughing up blood, and pleurisy. GI: Denies vomiting blood, jaundice, and fecal incontinence.   Denies dysphagia or odynophagia. Derm: Denies rash, itching, dry skin Psych: Denies depression, anxiety, memory loss, confusion. No homicidal or suicidal ideation.  Heme: Denies bruising, bleeding, and enlarged lymph nodes.  Physical Exam: There were no vitals taken for this visit. General:   Alert and oriented. No distress noted. Pleasant and cooperative.  Head:  Normocephalic and atraumatic. Eyes:  Conjuctiva clear without scleral icterus. Mouth:  Oral mucosa pink and moist. Good dentition. No lesions. Heart:  S1, S2 present without murmurs appreciated. Lungs:  Clear to auscultation bilaterally. No wheezes, rales, or rhonchi. No distress.  Abdomen:  +BS, soft, non-tender and non-distended. No rebound or guarding. No HSM or masses noted. Msk:  Symmetrical without gross  deformities. Normal posture. Extremities:  Without edema. Neurologic:  Alert and  oriented x4 Psych:  Alert and cooperative. Normal mood and affect.

## 2019-12-21 ENCOUNTER — Encounter: Payer: Self-pay | Admitting: Gastroenterology

## 2019-12-21 ENCOUNTER — Ambulatory Visit: Payer: Medicare Other | Admitting: Gastroenterology

## 2019-12-28 DIAGNOSIS — M542 Cervicalgia: Secondary | ICD-10-CM | POA: Diagnosis not present

## 2019-12-28 DIAGNOSIS — M9902 Segmental and somatic dysfunction of thoracic region: Secondary | ICD-10-CM | POA: Diagnosis not present

## 2019-12-28 DIAGNOSIS — M9901 Segmental and somatic dysfunction of cervical region: Secondary | ICD-10-CM | POA: Diagnosis not present

## 2019-12-28 DIAGNOSIS — M546 Pain in thoracic spine: Secondary | ICD-10-CM | POA: Diagnosis not present

## 2019-12-28 DIAGNOSIS — M545 Low back pain: Secondary | ICD-10-CM | POA: Diagnosis not present

## 2019-12-28 DIAGNOSIS — M9903 Segmental and somatic dysfunction of lumbar region: Secondary | ICD-10-CM | POA: Diagnosis not present

## 2019-12-31 DIAGNOSIS — G4733 Obstructive sleep apnea (adult) (pediatric): Secondary | ICD-10-CM | POA: Diagnosis not present

## 2020-01-05 DIAGNOSIS — Z1389 Encounter for screening for other disorder: Secondary | ICD-10-CM | POA: Diagnosis not present

## 2020-01-05 DIAGNOSIS — Z6841 Body Mass Index (BMI) 40.0 and over, adult: Secondary | ICD-10-CM | POA: Diagnosis not present

## 2020-01-05 DIAGNOSIS — R5383 Other fatigue: Secondary | ICD-10-CM | POA: Diagnosis not present

## 2020-01-05 DIAGNOSIS — Z0000 Encounter for general adult medical examination without abnormal findings: Secondary | ICD-10-CM | POA: Diagnosis not present

## 2020-01-05 DIAGNOSIS — Z0001 Encounter for general adult medical examination with abnormal findings: Secondary | ICD-10-CM | POA: Diagnosis not present

## 2020-01-05 DIAGNOSIS — E1165 Type 2 diabetes mellitus with hyperglycemia: Secondary | ICD-10-CM | POA: Diagnosis not present

## 2020-01-05 DIAGNOSIS — H8103 Meniere's disease, bilateral: Secondary | ICD-10-CM | POA: Diagnosis not present

## 2020-01-13 DIAGNOSIS — M9902 Segmental and somatic dysfunction of thoracic region: Secondary | ICD-10-CM | POA: Diagnosis not present

## 2020-01-13 DIAGNOSIS — M9901 Segmental and somatic dysfunction of cervical region: Secondary | ICD-10-CM | POA: Diagnosis not present

## 2020-01-13 DIAGNOSIS — M9903 Segmental and somatic dysfunction of lumbar region: Secondary | ICD-10-CM | POA: Diagnosis not present

## 2020-01-13 DIAGNOSIS — M542 Cervicalgia: Secondary | ICD-10-CM | POA: Diagnosis not present

## 2020-01-13 DIAGNOSIS — M545 Low back pain: Secondary | ICD-10-CM | POA: Diagnosis not present

## 2020-01-13 DIAGNOSIS — M546 Pain in thoracic spine: Secondary | ICD-10-CM | POA: Diagnosis not present

## 2020-01-29 DIAGNOSIS — M545 Low back pain: Secondary | ICD-10-CM | POA: Diagnosis not present

## 2020-01-29 DIAGNOSIS — M542 Cervicalgia: Secondary | ICD-10-CM | POA: Diagnosis not present

## 2020-01-29 DIAGNOSIS — M9902 Segmental and somatic dysfunction of thoracic region: Secondary | ICD-10-CM | POA: Diagnosis not present

## 2020-01-29 DIAGNOSIS — M546 Pain in thoracic spine: Secondary | ICD-10-CM | POA: Diagnosis not present

## 2020-01-29 DIAGNOSIS — M9903 Segmental and somatic dysfunction of lumbar region: Secondary | ICD-10-CM | POA: Diagnosis not present

## 2020-01-29 DIAGNOSIS — M9901 Segmental and somatic dysfunction of cervical region: Secondary | ICD-10-CM | POA: Diagnosis not present

## 2020-02-05 DIAGNOSIS — M9902 Segmental and somatic dysfunction of thoracic region: Secondary | ICD-10-CM | POA: Diagnosis not present

## 2020-02-05 DIAGNOSIS — M9903 Segmental and somatic dysfunction of lumbar region: Secondary | ICD-10-CM | POA: Diagnosis not present

## 2020-02-05 DIAGNOSIS — M9901 Segmental and somatic dysfunction of cervical region: Secondary | ICD-10-CM | POA: Diagnosis not present

## 2020-02-05 DIAGNOSIS — M545 Low back pain: Secondary | ICD-10-CM | POA: Diagnosis not present

## 2020-02-05 DIAGNOSIS — M546 Pain in thoracic spine: Secondary | ICD-10-CM | POA: Diagnosis not present

## 2020-02-05 DIAGNOSIS — M542 Cervicalgia: Secondary | ICD-10-CM | POA: Diagnosis not present

## 2020-02-12 DIAGNOSIS — M9903 Segmental and somatic dysfunction of lumbar region: Secondary | ICD-10-CM | POA: Diagnosis not present

## 2020-02-12 DIAGNOSIS — M545 Low back pain: Secondary | ICD-10-CM | POA: Diagnosis not present

## 2020-02-12 DIAGNOSIS — M9902 Segmental and somatic dysfunction of thoracic region: Secondary | ICD-10-CM | POA: Diagnosis not present

## 2020-02-12 DIAGNOSIS — M542 Cervicalgia: Secondary | ICD-10-CM | POA: Diagnosis not present

## 2020-02-12 DIAGNOSIS — M546 Pain in thoracic spine: Secondary | ICD-10-CM | POA: Diagnosis not present

## 2020-02-12 DIAGNOSIS — M9901 Segmental and somatic dysfunction of cervical region: Secondary | ICD-10-CM | POA: Diagnosis not present

## 2020-02-19 DIAGNOSIS — M9902 Segmental and somatic dysfunction of thoracic region: Secondary | ICD-10-CM | POA: Diagnosis not present

## 2020-02-19 DIAGNOSIS — M546 Pain in thoracic spine: Secondary | ICD-10-CM | POA: Diagnosis not present

## 2020-02-19 DIAGNOSIS — M9903 Segmental and somatic dysfunction of lumbar region: Secondary | ICD-10-CM | POA: Diagnosis not present

## 2020-02-19 DIAGNOSIS — M542 Cervicalgia: Secondary | ICD-10-CM | POA: Diagnosis not present

## 2020-02-19 DIAGNOSIS — M9901 Segmental and somatic dysfunction of cervical region: Secondary | ICD-10-CM | POA: Diagnosis not present

## 2020-02-19 DIAGNOSIS — M545 Low back pain: Secondary | ICD-10-CM | POA: Diagnosis not present

## 2020-02-26 DIAGNOSIS — M9902 Segmental and somatic dysfunction of thoracic region: Secondary | ICD-10-CM | POA: Diagnosis not present

## 2020-02-26 DIAGNOSIS — M9903 Segmental and somatic dysfunction of lumbar region: Secondary | ICD-10-CM | POA: Diagnosis not present

## 2020-02-26 DIAGNOSIS — M545 Low back pain: Secondary | ICD-10-CM | POA: Diagnosis not present

## 2020-02-26 DIAGNOSIS — M9901 Segmental and somatic dysfunction of cervical region: Secondary | ICD-10-CM | POA: Diagnosis not present

## 2020-02-26 DIAGNOSIS — M546 Pain in thoracic spine: Secondary | ICD-10-CM | POA: Diagnosis not present

## 2020-02-26 DIAGNOSIS — M542 Cervicalgia: Secondary | ICD-10-CM | POA: Diagnosis not present

## 2020-03-04 DIAGNOSIS — M542 Cervicalgia: Secondary | ICD-10-CM | POA: Diagnosis not present

## 2020-03-04 DIAGNOSIS — M9902 Segmental and somatic dysfunction of thoracic region: Secondary | ICD-10-CM | POA: Diagnosis not present

## 2020-03-04 DIAGNOSIS — M9901 Segmental and somatic dysfunction of cervical region: Secondary | ICD-10-CM | POA: Diagnosis not present

## 2020-03-04 DIAGNOSIS — M9903 Segmental and somatic dysfunction of lumbar region: Secondary | ICD-10-CM | POA: Diagnosis not present

## 2020-03-04 DIAGNOSIS — M546 Pain in thoracic spine: Secondary | ICD-10-CM | POA: Diagnosis not present

## 2020-03-04 DIAGNOSIS — M545 Low back pain: Secondary | ICD-10-CM | POA: Diagnosis not present

## 2020-03-09 DIAGNOSIS — M9901 Segmental and somatic dysfunction of cervical region: Secondary | ICD-10-CM | POA: Diagnosis not present

## 2020-03-09 DIAGNOSIS — M9902 Segmental and somatic dysfunction of thoracic region: Secondary | ICD-10-CM | POA: Diagnosis not present

## 2020-03-09 DIAGNOSIS — M545 Low back pain: Secondary | ICD-10-CM | POA: Diagnosis not present

## 2020-03-09 DIAGNOSIS — M542 Cervicalgia: Secondary | ICD-10-CM | POA: Diagnosis not present

## 2020-03-09 DIAGNOSIS — M9903 Segmental and somatic dysfunction of lumbar region: Secondary | ICD-10-CM | POA: Diagnosis not present

## 2020-03-09 DIAGNOSIS — M546 Pain in thoracic spine: Secondary | ICD-10-CM | POA: Diagnosis not present

## 2020-03-14 DIAGNOSIS — M5412 Radiculopathy, cervical region: Secondary | ICD-10-CM | POA: Diagnosis not present

## 2020-03-14 DIAGNOSIS — M5030 Other cervical disc degeneration, unspecified cervical region: Secondary | ICD-10-CM | POA: Diagnosis not present

## 2020-03-14 DIAGNOSIS — R200 Anesthesia of skin: Secondary | ICD-10-CM | POA: Diagnosis not present

## 2020-03-17 DIAGNOSIS — G4733 Obstructive sleep apnea (adult) (pediatric): Secondary | ICD-10-CM | POA: Diagnosis not present

## 2020-03-18 DIAGNOSIS — M545 Low back pain: Secondary | ICD-10-CM | POA: Diagnosis not present

## 2020-03-18 DIAGNOSIS — M9902 Segmental and somatic dysfunction of thoracic region: Secondary | ICD-10-CM | POA: Diagnosis not present

## 2020-03-18 DIAGNOSIS — M546 Pain in thoracic spine: Secondary | ICD-10-CM | POA: Diagnosis not present

## 2020-03-18 DIAGNOSIS — M9903 Segmental and somatic dysfunction of lumbar region: Secondary | ICD-10-CM | POA: Diagnosis not present

## 2020-03-18 DIAGNOSIS — M542 Cervicalgia: Secondary | ICD-10-CM | POA: Diagnosis not present

## 2020-03-18 DIAGNOSIS — M9901 Segmental and somatic dysfunction of cervical region: Secondary | ICD-10-CM | POA: Diagnosis not present

## 2020-03-25 DIAGNOSIS — M5412 Radiculopathy, cervical region: Secondary | ICD-10-CM | POA: Diagnosis not present

## 2020-03-25 DIAGNOSIS — M9902 Segmental and somatic dysfunction of thoracic region: Secondary | ICD-10-CM | POA: Diagnosis not present

## 2020-03-25 DIAGNOSIS — M546 Pain in thoracic spine: Secondary | ICD-10-CM | POA: Diagnosis not present

## 2020-03-25 DIAGNOSIS — M9903 Segmental and somatic dysfunction of lumbar region: Secondary | ICD-10-CM | POA: Diagnosis not present

## 2020-03-25 DIAGNOSIS — M9901 Segmental and somatic dysfunction of cervical region: Secondary | ICD-10-CM | POA: Diagnosis not present

## 2020-03-28 DIAGNOSIS — M4722 Other spondylosis with radiculopathy, cervical region: Secondary | ICD-10-CM | POA: Diagnosis not present

## 2020-03-30 ENCOUNTER — Other Ambulatory Visit: Payer: Self-pay

## 2020-03-30 ENCOUNTER — Encounter: Payer: Self-pay | Admitting: Neurology

## 2020-03-30 DIAGNOSIS — G4733 Obstructive sleep apnea (adult) (pediatric): Secondary | ICD-10-CM | POA: Diagnosis not present

## 2020-03-30 DIAGNOSIS — R202 Paresthesia of skin: Secondary | ICD-10-CM

## 2020-04-08 DIAGNOSIS — M9901 Segmental and somatic dysfunction of cervical region: Secondary | ICD-10-CM | POA: Diagnosis not present

## 2020-04-08 DIAGNOSIS — M546 Pain in thoracic spine: Secondary | ICD-10-CM | POA: Diagnosis not present

## 2020-04-08 DIAGNOSIS — M9903 Segmental and somatic dysfunction of lumbar region: Secondary | ICD-10-CM | POA: Diagnosis not present

## 2020-04-08 DIAGNOSIS — M5412 Radiculopathy, cervical region: Secondary | ICD-10-CM | POA: Diagnosis not present

## 2020-04-08 DIAGNOSIS — M9902 Segmental and somatic dysfunction of thoracic region: Secondary | ICD-10-CM | POA: Diagnosis not present

## 2020-04-22 DIAGNOSIS — M5412 Radiculopathy, cervical region: Secondary | ICD-10-CM | POA: Diagnosis not present

## 2020-04-22 DIAGNOSIS — M9902 Segmental and somatic dysfunction of thoracic region: Secondary | ICD-10-CM | POA: Diagnosis not present

## 2020-04-22 DIAGNOSIS — M9903 Segmental and somatic dysfunction of lumbar region: Secondary | ICD-10-CM | POA: Diagnosis not present

## 2020-04-22 DIAGNOSIS — M9901 Segmental and somatic dysfunction of cervical region: Secondary | ICD-10-CM | POA: Diagnosis not present

## 2020-04-22 DIAGNOSIS — M546 Pain in thoracic spine: Secondary | ICD-10-CM | POA: Diagnosis not present

## 2020-05-06 DIAGNOSIS — M546 Pain in thoracic spine: Secondary | ICD-10-CM | POA: Diagnosis not present

## 2020-05-06 DIAGNOSIS — M9902 Segmental and somatic dysfunction of thoracic region: Secondary | ICD-10-CM | POA: Diagnosis not present

## 2020-05-06 DIAGNOSIS — M9903 Segmental and somatic dysfunction of lumbar region: Secondary | ICD-10-CM | POA: Diagnosis not present

## 2020-05-06 DIAGNOSIS — M5412 Radiculopathy, cervical region: Secondary | ICD-10-CM | POA: Diagnosis not present

## 2020-05-06 DIAGNOSIS — M9901 Segmental and somatic dysfunction of cervical region: Secondary | ICD-10-CM | POA: Diagnosis not present

## 2020-05-10 ENCOUNTER — Encounter: Payer: Medicare HMO | Admitting: Neurology

## 2020-05-23 DIAGNOSIS — H8109 Meniere's disease, unspecified ear: Secondary | ICD-10-CM | POA: Diagnosis not present

## 2020-05-23 DIAGNOSIS — H903 Sensorineural hearing loss, bilateral: Secondary | ICD-10-CM | POA: Diagnosis not present

## 2020-05-30 DIAGNOSIS — M9901 Segmental and somatic dysfunction of cervical region: Secondary | ICD-10-CM | POA: Diagnosis not present

## 2020-05-30 DIAGNOSIS — M5412 Radiculopathy, cervical region: Secondary | ICD-10-CM | POA: Diagnosis not present

## 2020-05-30 DIAGNOSIS — M546 Pain in thoracic spine: Secondary | ICD-10-CM | POA: Diagnosis not present

## 2020-05-30 DIAGNOSIS — M9903 Segmental and somatic dysfunction of lumbar region: Secondary | ICD-10-CM | POA: Diagnosis not present

## 2020-05-30 DIAGNOSIS — M9902 Segmental and somatic dysfunction of thoracic region: Secondary | ICD-10-CM | POA: Diagnosis not present

## 2020-06-15 DIAGNOSIS — M9901 Segmental and somatic dysfunction of cervical region: Secondary | ICD-10-CM | POA: Diagnosis not present

## 2020-06-15 DIAGNOSIS — M546 Pain in thoracic spine: Secondary | ICD-10-CM | POA: Diagnosis not present

## 2020-06-15 DIAGNOSIS — M5412 Radiculopathy, cervical region: Secondary | ICD-10-CM | POA: Diagnosis not present

## 2020-06-15 DIAGNOSIS — M9903 Segmental and somatic dysfunction of lumbar region: Secondary | ICD-10-CM | POA: Diagnosis not present

## 2020-06-15 DIAGNOSIS — M9902 Segmental and somatic dysfunction of thoracic region: Secondary | ICD-10-CM | POA: Diagnosis not present

## 2020-06-22 ENCOUNTER — Encounter: Payer: Medicare HMO | Admitting: Neurology

## 2020-06-22 DIAGNOSIS — G4733 Obstructive sleep apnea (adult) (pediatric): Secondary | ICD-10-CM | POA: Diagnosis not present

## 2020-06-24 DIAGNOSIS — F4542 Pain disorder with related psychological factors: Secondary | ICD-10-CM | POA: Diagnosis not present

## 2020-06-24 DIAGNOSIS — G894 Chronic pain syndrome: Secondary | ICD-10-CM | POA: Diagnosis not present

## 2020-06-24 DIAGNOSIS — I10 Essential (primary) hypertension: Secondary | ICD-10-CM | POA: Diagnosis not present

## 2020-06-28 DIAGNOSIS — G4733 Obstructive sleep apnea (adult) (pediatric): Secondary | ICD-10-CM | POA: Diagnosis not present

## 2020-06-29 DIAGNOSIS — M9902 Segmental and somatic dysfunction of thoracic region: Secondary | ICD-10-CM | POA: Diagnosis not present

## 2020-06-29 DIAGNOSIS — M5412 Radiculopathy, cervical region: Secondary | ICD-10-CM | POA: Diagnosis not present

## 2020-06-29 DIAGNOSIS — M546 Pain in thoracic spine: Secondary | ICD-10-CM | POA: Diagnosis not present

## 2020-06-29 DIAGNOSIS — M9901 Segmental and somatic dysfunction of cervical region: Secondary | ICD-10-CM | POA: Diagnosis not present

## 2020-06-29 DIAGNOSIS — M9903 Segmental and somatic dysfunction of lumbar region: Secondary | ICD-10-CM | POA: Diagnosis not present

## 2020-07-18 ENCOUNTER — Other Ambulatory Visit: Payer: Self-pay

## 2020-07-18 ENCOUNTER — Ambulatory Visit (HOSPITAL_COMMUNITY)
Admission: RE | Admit: 2020-07-18 | Discharge: 2020-07-18 | Disposition: A | Discharge status: Home / Self Care | Payer: Medicare HMO | Source: Ambulatory Visit | Attending: Physician Assistant | Admitting: Physician Assistant

## 2020-07-18 ENCOUNTER — Other Ambulatory Visit (HOSPITAL_COMMUNITY): Payer: Self-pay | Admitting: Physician Assistant

## 2020-07-18 DIAGNOSIS — Z6841 Body Mass Index (BMI) 40.0 and over, adult: Secondary | ICD-10-CM | POA: Diagnosis not present

## 2020-07-18 DIAGNOSIS — M545 Low back pain, unspecified: Secondary | ICD-10-CM | POA: Diagnosis not present

## 2020-07-18 DIAGNOSIS — R0781 Pleurodynia: Secondary | ICD-10-CM | POA: Diagnosis not present

## 2020-07-18 DIAGNOSIS — K5900 Constipation, unspecified: Secondary | ICD-10-CM | POA: Diagnosis not present

## 2020-07-18 DIAGNOSIS — R079 Chest pain, unspecified: Secondary | ICD-10-CM | POA: Diagnosis not present

## 2020-07-23 DIAGNOSIS — I10 Essential (primary) hypertension: Secondary | ICD-10-CM | POA: Diagnosis not present

## 2020-07-23 DIAGNOSIS — F4542 Pain disorder with related psychological factors: Secondary | ICD-10-CM | POA: Diagnosis not present

## 2020-07-23 DIAGNOSIS — G894 Chronic pain syndrome: Secondary | ICD-10-CM | POA: Diagnosis not present

## 2020-08-09 ENCOUNTER — Encounter: Payer: Medicare HMO | Admitting: Neurology

## 2020-09-05 DIAGNOSIS — M9901 Segmental and somatic dysfunction of cervical region: Secondary | ICD-10-CM | POA: Diagnosis not present

## 2020-09-05 DIAGNOSIS — M9902 Segmental and somatic dysfunction of thoracic region: Secondary | ICD-10-CM | POA: Diagnosis not present

## 2020-09-05 DIAGNOSIS — M9903 Segmental and somatic dysfunction of lumbar region: Secondary | ICD-10-CM | POA: Diagnosis not present

## 2020-09-05 DIAGNOSIS — M546 Pain in thoracic spine: Secondary | ICD-10-CM | POA: Diagnosis not present

## 2020-09-05 DIAGNOSIS — M5412 Radiculopathy, cervical region: Secondary | ICD-10-CM | POA: Diagnosis not present

## 2020-09-12 DIAGNOSIS — M546 Pain in thoracic spine: Secondary | ICD-10-CM | POA: Diagnosis not present

## 2020-09-12 DIAGNOSIS — M9902 Segmental and somatic dysfunction of thoracic region: Secondary | ICD-10-CM | POA: Diagnosis not present

## 2020-09-12 DIAGNOSIS — M9903 Segmental and somatic dysfunction of lumbar region: Secondary | ICD-10-CM | POA: Diagnosis not present

## 2020-09-12 DIAGNOSIS — M9901 Segmental and somatic dysfunction of cervical region: Secondary | ICD-10-CM | POA: Diagnosis not present

## 2020-09-12 DIAGNOSIS — M5412 Radiculopathy, cervical region: Secondary | ICD-10-CM | POA: Diagnosis not present

## 2020-09-20 DIAGNOSIS — G4733 Obstructive sleep apnea (adult) (pediatric): Secondary | ICD-10-CM | POA: Diagnosis not present

## 2020-09-22 DIAGNOSIS — I10 Essential (primary) hypertension: Secondary | ICD-10-CM | POA: Diagnosis not present

## 2020-09-22 DIAGNOSIS — G894 Chronic pain syndrome: Secondary | ICD-10-CM | POA: Diagnosis not present

## 2020-09-22 DIAGNOSIS — F4542 Pain disorder with related psychological factors: Secondary | ICD-10-CM | POA: Diagnosis not present

## 2020-09-26 DIAGNOSIS — M5412 Radiculopathy, cervical region: Secondary | ICD-10-CM | POA: Diagnosis not present

## 2020-09-26 DIAGNOSIS — M546 Pain in thoracic spine: Secondary | ICD-10-CM | POA: Diagnosis not present

## 2020-09-26 DIAGNOSIS — M9901 Segmental and somatic dysfunction of cervical region: Secondary | ICD-10-CM | POA: Diagnosis not present

## 2020-09-26 DIAGNOSIS — M9903 Segmental and somatic dysfunction of lumbar region: Secondary | ICD-10-CM | POA: Diagnosis not present

## 2020-09-26 DIAGNOSIS — M9902 Segmental and somatic dysfunction of thoracic region: Secondary | ICD-10-CM | POA: Diagnosis not present

## 2020-10-07 DIAGNOSIS — E7849 Other hyperlipidemia: Secondary | ICD-10-CM | POA: Diagnosis not present

## 2020-10-07 DIAGNOSIS — E1165 Type 2 diabetes mellitus with hyperglycemia: Secondary | ICD-10-CM | POA: Diagnosis not present

## 2020-10-10 DIAGNOSIS — M9902 Segmental and somatic dysfunction of thoracic region: Secondary | ICD-10-CM | POA: Diagnosis not present

## 2020-10-10 DIAGNOSIS — M9903 Segmental and somatic dysfunction of lumbar region: Secondary | ICD-10-CM | POA: Diagnosis not present

## 2020-10-10 DIAGNOSIS — M9901 Segmental and somatic dysfunction of cervical region: Secondary | ICD-10-CM | POA: Diagnosis not present

## 2020-10-10 DIAGNOSIS — M5412 Radiculopathy, cervical region: Secondary | ICD-10-CM | POA: Diagnosis not present

## 2020-10-10 DIAGNOSIS — M546 Pain in thoracic spine: Secondary | ICD-10-CM | POA: Diagnosis not present

## 2020-10-31 DIAGNOSIS — G4733 Obstructive sleep apnea (adult) (pediatric): Secondary | ICD-10-CM | POA: Diagnosis not present

## 2020-11-11 DIAGNOSIS — M5412 Radiculopathy, cervical region: Secondary | ICD-10-CM | POA: Diagnosis not present

## 2020-11-11 DIAGNOSIS — M9901 Segmental and somatic dysfunction of cervical region: Secondary | ICD-10-CM | POA: Diagnosis not present

## 2020-11-11 DIAGNOSIS — M9903 Segmental and somatic dysfunction of lumbar region: Secondary | ICD-10-CM | POA: Diagnosis not present

## 2020-11-11 DIAGNOSIS — M9902 Segmental and somatic dysfunction of thoracic region: Secondary | ICD-10-CM | POA: Diagnosis not present

## 2020-11-11 DIAGNOSIS — M546 Pain in thoracic spine: Secondary | ICD-10-CM | POA: Diagnosis not present

## 2020-11-12 DIAGNOSIS — E119 Type 2 diabetes mellitus without complications: Secondary | ICD-10-CM | POA: Diagnosis not present

## 2020-11-12 DIAGNOSIS — H17823 Peripheral opacity of cornea, bilateral: Secondary | ICD-10-CM | POA: Diagnosis not present

## 2020-11-12 DIAGNOSIS — G43B1 Ophthalmoplegic migraine, intractable: Secondary | ICD-10-CM | POA: Diagnosis not present

## 2020-11-12 DIAGNOSIS — E089 Diabetes mellitus due to underlying condition without complications: Secondary | ICD-10-CM | POA: Diagnosis not present

## 2020-11-12 DIAGNOSIS — H524 Presbyopia: Secondary | ICD-10-CM | POA: Diagnosis not present

## 2020-11-23 DIAGNOSIS — M9901 Segmental and somatic dysfunction of cervical region: Secondary | ICD-10-CM | POA: Diagnosis not present

## 2020-11-23 DIAGNOSIS — M546 Pain in thoracic spine: Secondary | ICD-10-CM | POA: Diagnosis not present

## 2020-11-23 DIAGNOSIS — M542 Cervicalgia: Secondary | ICD-10-CM | POA: Diagnosis not present

## 2020-11-23 DIAGNOSIS — M9902 Segmental and somatic dysfunction of thoracic region: Secondary | ICD-10-CM | POA: Diagnosis not present

## 2020-11-23 DIAGNOSIS — M9903 Segmental and somatic dysfunction of lumbar region: Secondary | ICD-10-CM | POA: Diagnosis not present

## 2020-12-21 DIAGNOSIS — M9902 Segmental and somatic dysfunction of thoracic region: Secondary | ICD-10-CM | POA: Diagnosis not present

## 2020-12-21 DIAGNOSIS — M546 Pain in thoracic spine: Secondary | ICD-10-CM | POA: Diagnosis not present

## 2020-12-21 DIAGNOSIS — G4733 Obstructive sleep apnea (adult) (pediatric): Secondary | ICD-10-CM | POA: Diagnosis not present

## 2020-12-21 DIAGNOSIS — M542 Cervicalgia: Secondary | ICD-10-CM | POA: Diagnosis not present

## 2020-12-21 DIAGNOSIS — M9903 Segmental and somatic dysfunction of lumbar region: Secondary | ICD-10-CM | POA: Diagnosis not present

## 2020-12-21 DIAGNOSIS — M9901 Segmental and somatic dysfunction of cervical region: Secondary | ICD-10-CM | POA: Diagnosis not present

## 2020-12-22 DIAGNOSIS — E1165 Type 2 diabetes mellitus with hyperglycemia: Secondary | ICD-10-CM | POA: Diagnosis not present

## 2020-12-31 IMAGING — MR MRI CERVICAL SPINE WITHOUT CONTRAST
4 of 5 series · 15 of 48 positions shown · non-contrast
Comparison: Brain MRI 02/03/2013.

CLINICAL DATA: 47-year-old male with right shoulder pain for 3
months. Cervical radiculopathy.

EXAM:
MRI CERVICAL SPINE WITHOUT CONTRAST
TECHNIQUE: Multiplanar, multisequence MR imaging of the cervical spine was
performed. No intravenous contrast was administered.

[Series 3: T2 · sagittal · 3.0mm · 0.33mm/px · 6 of 15 slices shown (1 of 2)]
[im 1/15]
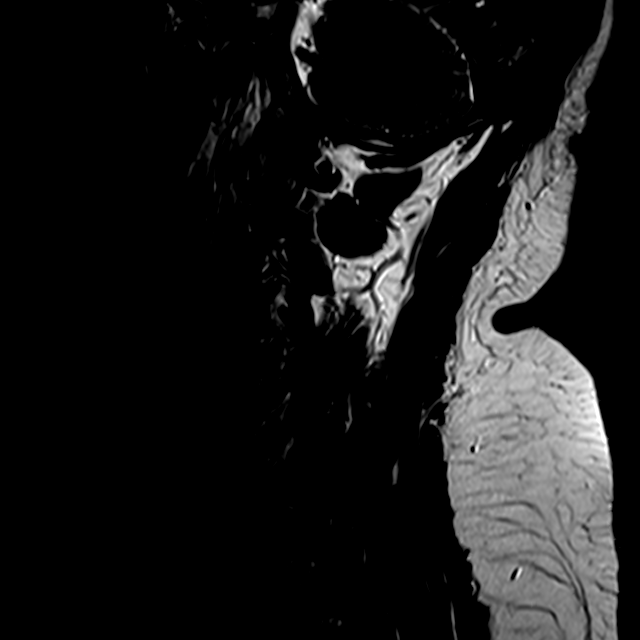
[im 3/15]
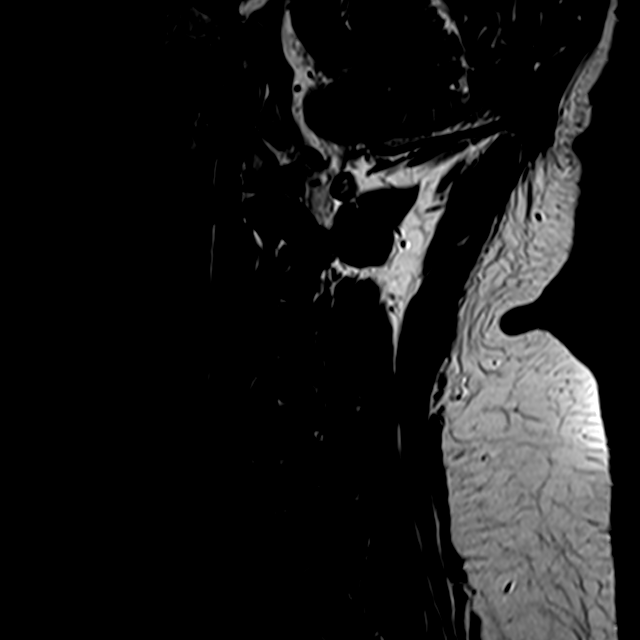
[im 5/15]
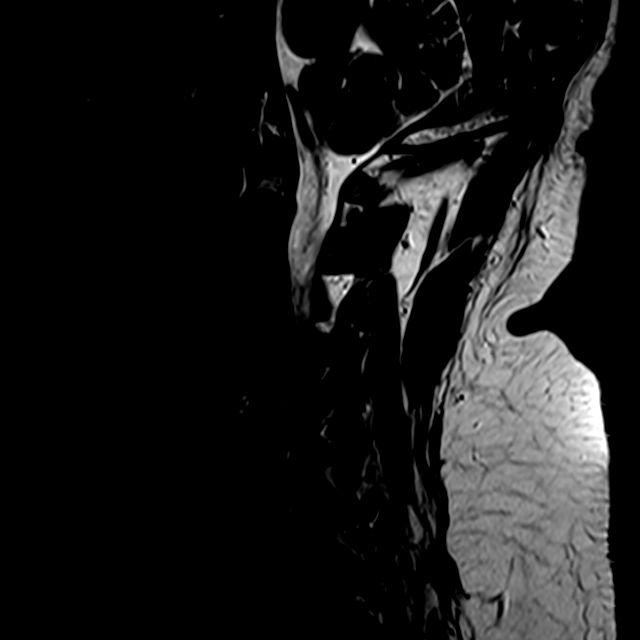
[im 8/15]
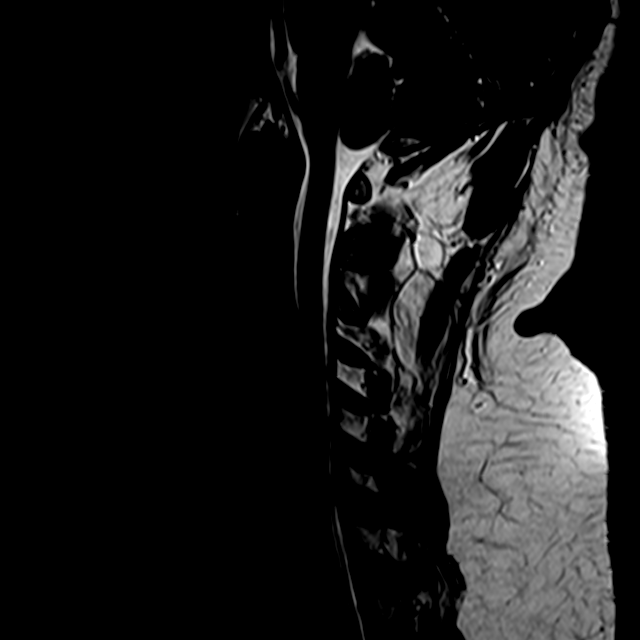
[im 10/15]
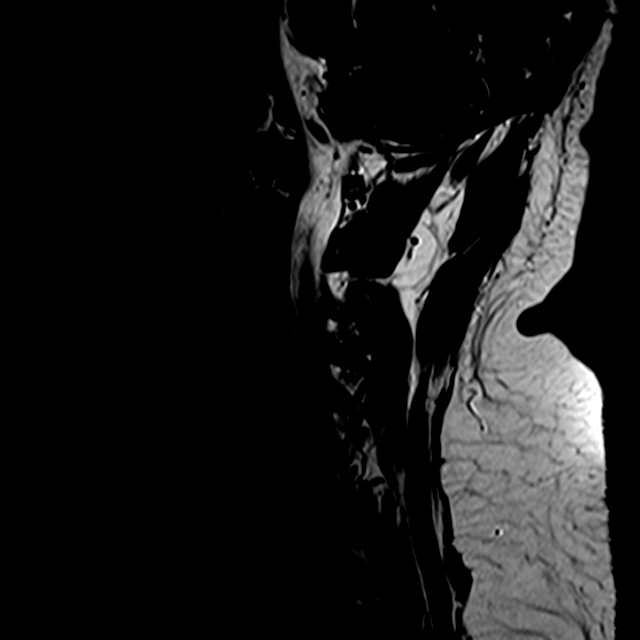
[im 12/15]
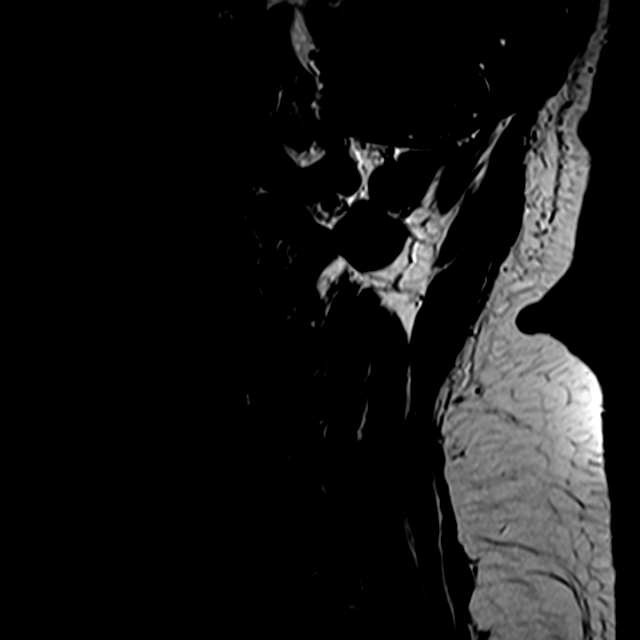

[Series 4: T1 · sagittal · 3.0mm · 0.70mm/px · 3 of 15 slices shown]
[im 3/15]
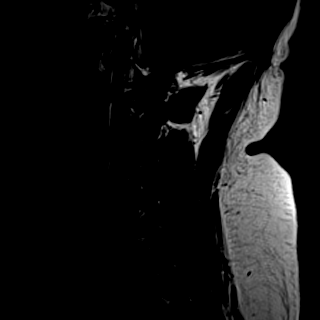
[im 8/15]
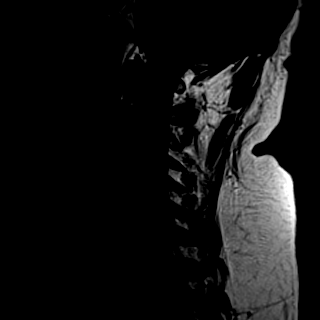
[im 12/15]
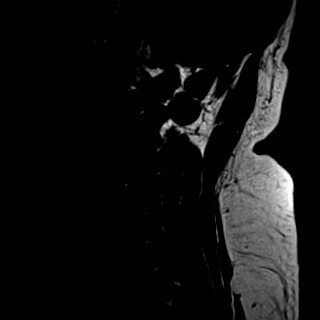

[Series 5: STIR · sagittal · 3.0mm · 0.34mm/px · 3 of 15 slices shown]
[im 3/15]
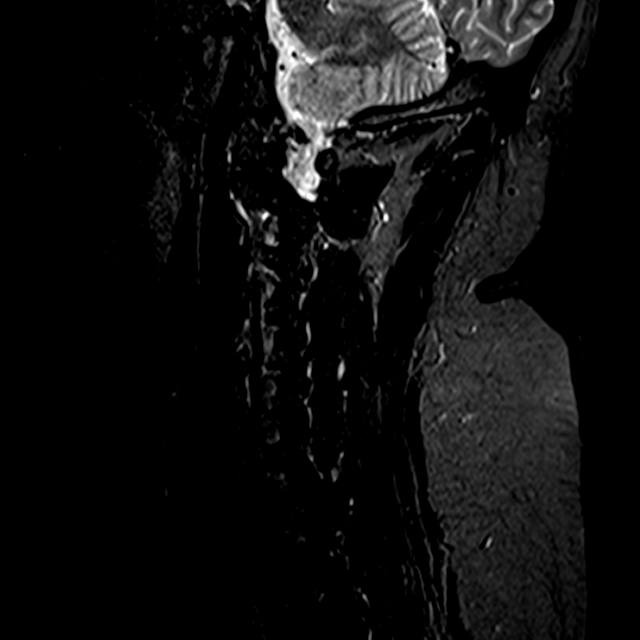
[im 9/15]
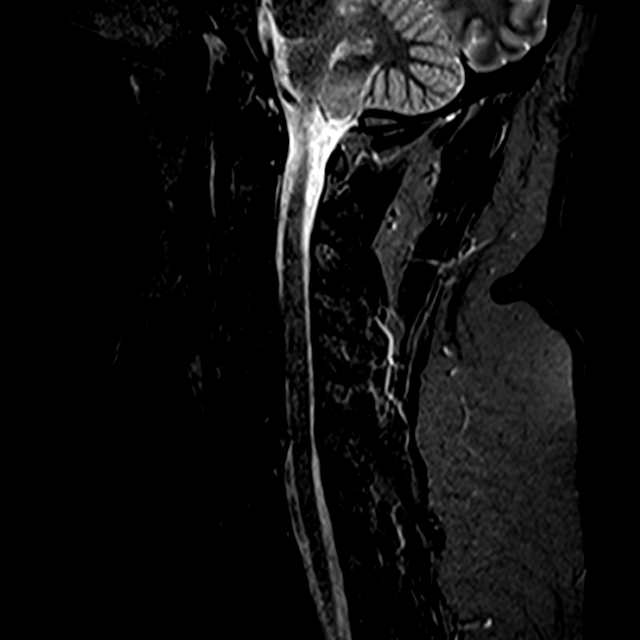
[im 15/15]
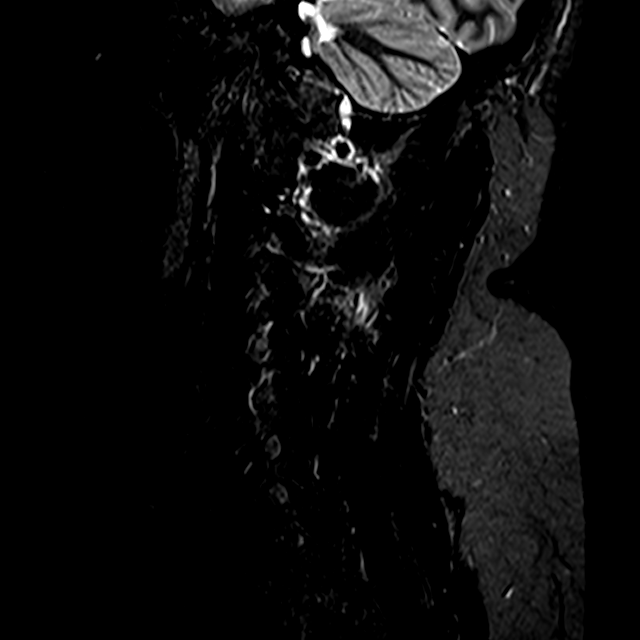

[Series 6: T2 · axial · 3.0mm · 0.28mm/px · z∈[-73,-2]mm · 3 of 34 slices shown (2 of 2)]
[im 6/34]
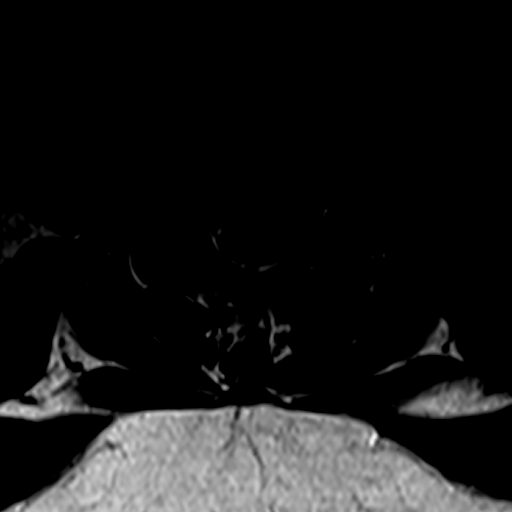
[im 18/34]
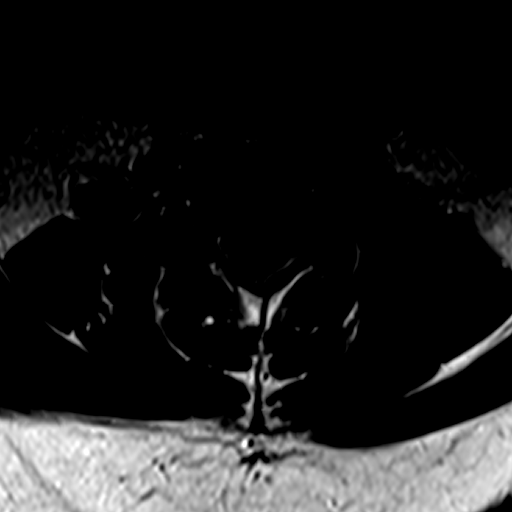
[im 28/34]
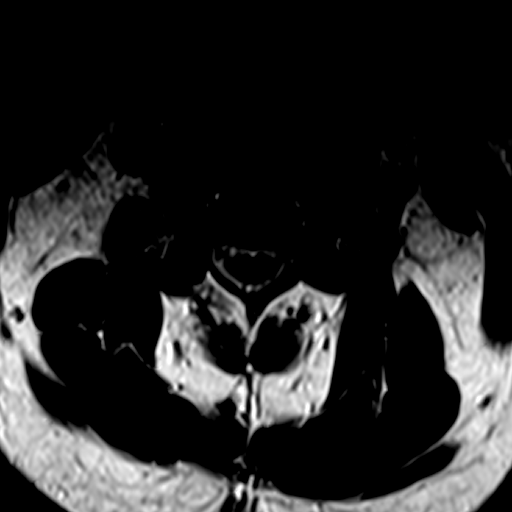

[15 of 48 positions shown; findings below may reference images not displayed]

FINDINGS: Alignment: Straightening of cervical lordosis. No spondylolisthesis.

Vertebrae: No marrow edema or evidence of acute osseous abnormality.
Visualized bone marrow signal is within normal limits.

Cord: Spinal cord signal is within normal limits at all visualized
levels.

Posterior Fossa, vertebral arteries, paraspinal tissues:
Cervicomedullary junction is within normal limits. Negative visible
brain parenchyma. Preserved major vascular flow voids in the neck.
Negative visible neck soft tissues.

Disc levels:

C2-C3:  Negative.

C3-C4: Small central disc protrusion without stenosis (series 7,
image 11).

C4-C5: Disc space loss. Circumferential disc bulge with prominent
rightward disc protrusion or disc osteophyte complex (series 5,
image 5 and series 6, image 16). Mild spinal stenosis and spinal
cord mass effect. Moderate to severe right C5 foraminal stenosis.

C5-C6: Disc space loss with circumferential disc osteophyte complex
eccentric to the right. Spinal stenosis with mild right hemi cord
mass effect. Moderate right C6 foraminal stenosis.

C6-C7: Disc space loss with central and leftward small disc
extrusion (series 5, image 10 and series 6, image 26). Mild endplate
spurring. Mild spinal stenosis. Mild if any spinal cord mass effect.
Mild to moderate left C7 foraminal stenosis.

C7-T1:  Mild facet hypertrophy.  No stenosis.

T1-T2: Negative.
IMPRESSION: 1. The symptomatic level is favored to be C4-C5 where a rightward
disc herniation versus disc osteophyte complex results in up to
severe right side foraminal stenosis, and mild spinal stenosis with
spinal cord mass effect.
Query right C5 radiculitis.
No spinal cord signal abnormality.
2. Rightward disc and endplate degeneration also at C5-C6 with mild
spinal stenosis and moderate right C6 neural foraminal stenosis.
3. Leftward disc herniation at C6-C7 with mild spinal stenosis and
up to moderate left C7 neural foraminal stenosis.

## 2021-03-09 DIAGNOSIS — Z0001 Encounter for general adult medical examination with abnormal findings: Secondary | ICD-10-CM | POA: Diagnosis not present

## 2021-03-09 DIAGNOSIS — E1165 Type 2 diabetes mellitus with hyperglycemia: Secondary | ICD-10-CM | POA: Diagnosis not present

## 2021-03-10 DIAGNOSIS — E1165 Type 2 diabetes mellitus with hyperglycemia: Secondary | ICD-10-CM | POA: Diagnosis not present

## 2021-03-24 DIAGNOSIS — G894 Chronic pain syndrome: Secondary | ICD-10-CM | POA: Diagnosis not present

## 2021-03-24 DIAGNOSIS — I10 Essential (primary) hypertension: Secondary | ICD-10-CM | POA: Diagnosis not present

## 2021-04-06 DIAGNOSIS — G4733 Obstructive sleep apnea (adult) (pediatric): Secondary | ICD-10-CM | POA: Diagnosis not present

## 2021-05-24 DIAGNOSIS — E782 Mixed hyperlipidemia: Secondary | ICD-10-CM | POA: Diagnosis not present

## 2021-05-24 DIAGNOSIS — K219 Gastro-esophageal reflux disease without esophagitis: Secondary | ICD-10-CM | POA: Diagnosis not present

## 2021-05-24 DIAGNOSIS — I10 Essential (primary) hypertension: Secondary | ICD-10-CM | POA: Diagnosis not present

## 2021-05-30 DIAGNOSIS — J22 Unspecified acute lower respiratory infection: Secondary | ICD-10-CM | POA: Diagnosis not present

## 2021-06-15 DIAGNOSIS — B359 Dermatophytosis, unspecified: Secondary | ICD-10-CM | POA: Diagnosis not present

## 2021-06-23 DIAGNOSIS — I10 Essential (primary) hypertension: Secondary | ICD-10-CM | POA: Diagnosis not present

## 2021-06-23 DIAGNOSIS — G894 Chronic pain syndrome: Secondary | ICD-10-CM | POA: Diagnosis not present

## 2021-07-05 DIAGNOSIS — G4733 Obstructive sleep apnea (adult) (pediatric): Secondary | ICD-10-CM | POA: Diagnosis not present

## 2021-10-09 DIAGNOSIS — M9901 Segmental and somatic dysfunction of cervical region: Secondary | ICD-10-CM | POA: Diagnosis not present

## 2021-10-09 DIAGNOSIS — M542 Cervicalgia: Secondary | ICD-10-CM | POA: Diagnosis not present

## 2021-10-09 DIAGNOSIS — M9902 Segmental and somatic dysfunction of thoracic region: Secondary | ICD-10-CM | POA: Diagnosis not present

## 2021-10-09 DIAGNOSIS — M9903 Segmental and somatic dysfunction of lumbar region: Secondary | ICD-10-CM | POA: Diagnosis not present

## 2021-10-09 DIAGNOSIS — M546 Pain in thoracic spine: Secondary | ICD-10-CM | POA: Diagnosis not present

## 2021-10-18 DIAGNOSIS — M9901 Segmental and somatic dysfunction of cervical region: Secondary | ICD-10-CM | POA: Diagnosis not present

## 2021-10-18 DIAGNOSIS — M9903 Segmental and somatic dysfunction of lumbar region: Secondary | ICD-10-CM | POA: Diagnosis not present

## 2021-10-18 DIAGNOSIS — M542 Cervicalgia: Secondary | ICD-10-CM | POA: Diagnosis not present

## 2021-10-18 DIAGNOSIS — M546 Pain in thoracic spine: Secondary | ICD-10-CM | POA: Diagnosis not present

## 2021-10-18 DIAGNOSIS — M9902 Segmental and somatic dysfunction of thoracic region: Secondary | ICD-10-CM | POA: Diagnosis not present

## 2021-11-02 DIAGNOSIS — K219 Gastro-esophageal reflux disease without esophagitis: Secondary | ICD-10-CM | POA: Diagnosis not present

## 2021-11-02 DIAGNOSIS — E782 Mixed hyperlipidemia: Secondary | ICD-10-CM | POA: Diagnosis not present

## 2021-11-02 DIAGNOSIS — B359 Dermatophytosis, unspecified: Secondary | ICD-10-CM | POA: Diagnosis not present

## 2021-11-02 DIAGNOSIS — I10 Essential (primary) hypertension: Secondary | ICD-10-CM | POA: Diagnosis not present

## 2021-11-02 DIAGNOSIS — H8103 Meniere's disease, bilateral: Secondary | ICD-10-CM | POA: Diagnosis not present

## 2021-11-02 DIAGNOSIS — E1165 Type 2 diabetes mellitus with hyperglycemia: Secondary | ICD-10-CM | POA: Diagnosis not present

## 2021-11-03 DIAGNOSIS — M9903 Segmental and somatic dysfunction of lumbar region: Secondary | ICD-10-CM | POA: Diagnosis not present

## 2021-11-03 DIAGNOSIS — M9901 Segmental and somatic dysfunction of cervical region: Secondary | ICD-10-CM | POA: Diagnosis not present

## 2021-11-03 DIAGNOSIS — M546 Pain in thoracic spine: Secondary | ICD-10-CM | POA: Diagnosis not present

## 2021-11-03 DIAGNOSIS — E1165 Type 2 diabetes mellitus with hyperglycemia: Secondary | ICD-10-CM | POA: Diagnosis not present

## 2021-11-03 DIAGNOSIS — M9902 Segmental and somatic dysfunction of thoracic region: Secondary | ICD-10-CM | POA: Diagnosis not present

## 2021-11-03 DIAGNOSIS — M542 Cervicalgia: Secondary | ICD-10-CM | POA: Diagnosis not present

## 2021-11-18 DIAGNOSIS — H524 Presbyopia: Secondary | ICD-10-CM | POA: Diagnosis not present

## 2021-11-18 DIAGNOSIS — E119 Type 2 diabetes mellitus without complications: Secondary | ICD-10-CM | POA: Diagnosis not present

## 2021-11-21 DIAGNOSIS — G4733 Obstructive sleep apnea (adult) (pediatric): Secondary | ICD-10-CM | POA: Diagnosis not present

## 2021-12-01 DIAGNOSIS — M9901 Segmental and somatic dysfunction of cervical region: Secondary | ICD-10-CM | POA: Diagnosis not present

## 2021-12-01 DIAGNOSIS — M542 Cervicalgia: Secondary | ICD-10-CM | POA: Diagnosis not present

## 2021-12-01 DIAGNOSIS — M9902 Segmental and somatic dysfunction of thoracic region: Secondary | ICD-10-CM | POA: Diagnosis not present

## 2021-12-01 DIAGNOSIS — M9903 Segmental and somatic dysfunction of lumbar region: Secondary | ICD-10-CM | POA: Diagnosis not present

## 2021-12-01 DIAGNOSIS — M546 Pain in thoracic spine: Secondary | ICD-10-CM | POA: Diagnosis not present

## 2021-12-27 DIAGNOSIS — M542 Cervicalgia: Secondary | ICD-10-CM | POA: Diagnosis not present

## 2021-12-27 DIAGNOSIS — M546 Pain in thoracic spine: Secondary | ICD-10-CM | POA: Diagnosis not present

## 2021-12-27 DIAGNOSIS — M9901 Segmental and somatic dysfunction of cervical region: Secondary | ICD-10-CM | POA: Diagnosis not present

## 2021-12-27 DIAGNOSIS — M9902 Segmental and somatic dysfunction of thoracic region: Secondary | ICD-10-CM | POA: Diagnosis not present

## 2021-12-27 DIAGNOSIS — M9903 Segmental and somatic dysfunction of lumbar region: Secondary | ICD-10-CM | POA: Diagnosis not present

## 2021-12-29 DIAGNOSIS — H8103 Meniere's disease, bilateral: Secondary | ICD-10-CM | POA: Diagnosis not present

## 2021-12-29 DIAGNOSIS — R21 Rash and other nonspecific skin eruption: Secondary | ICD-10-CM | POA: Diagnosis not present

## 2021-12-29 DIAGNOSIS — E1165 Type 2 diabetes mellitus with hyperglycemia: Secondary | ICD-10-CM | POA: Diagnosis not present

## 2021-12-29 DIAGNOSIS — H905 Unspecified sensorineural hearing loss: Secondary | ICD-10-CM | POA: Diagnosis not present

## 2022-01-03 DIAGNOSIS — E1165 Type 2 diabetes mellitus with hyperglycemia: Secondary | ICD-10-CM | POA: Diagnosis not present

## 2022-01-08 DIAGNOSIS — E1165 Type 2 diabetes mellitus with hyperglycemia: Secondary | ICD-10-CM | POA: Diagnosis not present

## 2022-01-24 DIAGNOSIS — M9903 Segmental and somatic dysfunction of lumbar region: Secondary | ICD-10-CM | POA: Diagnosis not present

## 2022-01-24 DIAGNOSIS — M546 Pain in thoracic spine: Secondary | ICD-10-CM | POA: Diagnosis not present

## 2022-01-24 DIAGNOSIS — M9902 Segmental and somatic dysfunction of thoracic region: Secondary | ICD-10-CM | POA: Diagnosis not present

## 2022-01-24 DIAGNOSIS — M542 Cervicalgia: Secondary | ICD-10-CM | POA: Diagnosis not present

## 2022-01-24 DIAGNOSIS — M9901 Segmental and somatic dysfunction of cervical region: Secondary | ICD-10-CM | POA: Diagnosis not present

## 2022-02-02 DIAGNOSIS — M9903 Segmental and somatic dysfunction of lumbar region: Secondary | ICD-10-CM | POA: Diagnosis not present

## 2022-02-02 DIAGNOSIS — M542 Cervicalgia: Secondary | ICD-10-CM | POA: Diagnosis not present

## 2022-02-02 DIAGNOSIS — M9901 Segmental and somatic dysfunction of cervical region: Secondary | ICD-10-CM | POA: Diagnosis not present

## 2022-02-02 DIAGNOSIS — M546 Pain in thoracic spine: Secondary | ICD-10-CM | POA: Diagnosis not present

## 2022-02-02 DIAGNOSIS — M9902 Segmental and somatic dysfunction of thoracic region: Secondary | ICD-10-CM | POA: Diagnosis not present

## 2022-02-20 DIAGNOSIS — G4733 Obstructive sleep apnea (adult) (pediatric): Secondary | ICD-10-CM | POA: Diagnosis not present

## 2022-02-21 DIAGNOSIS — M9902 Segmental and somatic dysfunction of thoracic region: Secondary | ICD-10-CM | POA: Diagnosis not present

## 2022-02-21 DIAGNOSIS — M9901 Segmental and somatic dysfunction of cervical region: Secondary | ICD-10-CM | POA: Diagnosis not present

## 2022-02-21 DIAGNOSIS — M546 Pain in thoracic spine: Secondary | ICD-10-CM | POA: Diagnosis not present

## 2022-02-21 DIAGNOSIS — M542 Cervicalgia: Secondary | ICD-10-CM | POA: Diagnosis not present

## 2022-02-21 DIAGNOSIS — M9903 Segmental and somatic dysfunction of lumbar region: Secondary | ICD-10-CM | POA: Diagnosis not present

## 2022-02-23 DIAGNOSIS — I10 Essential (primary) hypertension: Secondary | ICD-10-CM | POA: Diagnosis not present

## 2022-02-23 DIAGNOSIS — K219 Gastro-esophageal reflux disease without esophagitis: Secondary | ICD-10-CM | POA: Diagnosis not present

## 2022-02-23 DIAGNOSIS — E1165 Type 2 diabetes mellitus with hyperglycemia: Secondary | ICD-10-CM | POA: Diagnosis not present

## 2022-02-23 DIAGNOSIS — H905 Unspecified sensorineural hearing loss: Secondary | ICD-10-CM | POA: Diagnosis not present

## 2022-02-23 DIAGNOSIS — E782 Mixed hyperlipidemia: Secondary | ICD-10-CM | POA: Diagnosis not present

## 2022-02-23 DIAGNOSIS — H8103 Meniere's disease, bilateral: Secondary | ICD-10-CM | POA: Diagnosis not present

## 2022-02-23 DIAGNOSIS — Z0001 Encounter for general adult medical examination with abnormal findings: Secondary | ICD-10-CM | POA: Diagnosis not present

## 2022-02-27 DIAGNOSIS — E782 Mixed hyperlipidemia: Secondary | ICD-10-CM | POA: Diagnosis not present

## 2022-02-27 DIAGNOSIS — Z0001 Encounter for general adult medical examination with abnormal findings: Secondary | ICD-10-CM | POA: Diagnosis not present

## 2022-03-21 DIAGNOSIS — M9901 Segmental and somatic dysfunction of cervical region: Secondary | ICD-10-CM | POA: Diagnosis not present

## 2022-03-21 DIAGNOSIS — M542 Cervicalgia: Secondary | ICD-10-CM | POA: Diagnosis not present

## 2022-03-21 DIAGNOSIS — M546 Pain in thoracic spine: Secondary | ICD-10-CM | POA: Diagnosis not present

## 2022-03-21 DIAGNOSIS — M9902 Segmental and somatic dysfunction of thoracic region: Secondary | ICD-10-CM | POA: Diagnosis not present

## 2022-03-21 DIAGNOSIS — M9903 Segmental and somatic dysfunction of lumbar region: Secondary | ICD-10-CM | POA: Diagnosis not present

## 2022-03-25 DIAGNOSIS — G4733 Obstructive sleep apnea (adult) (pediatric): Secondary | ICD-10-CM | POA: Diagnosis not present

## 2022-04-18 DIAGNOSIS — M9902 Segmental and somatic dysfunction of thoracic region: Secondary | ICD-10-CM | POA: Diagnosis not present

## 2022-04-18 DIAGNOSIS — M542 Cervicalgia: Secondary | ICD-10-CM | POA: Diagnosis not present

## 2022-04-18 DIAGNOSIS — M9901 Segmental and somatic dysfunction of cervical region: Secondary | ICD-10-CM | POA: Diagnosis not present

## 2022-04-18 DIAGNOSIS — M546 Pain in thoracic spine: Secondary | ICD-10-CM | POA: Diagnosis not present

## 2022-04-18 DIAGNOSIS — M9903 Segmental and somatic dysfunction of lumbar region: Secondary | ICD-10-CM | POA: Diagnosis not present

## 2022-05-16 DIAGNOSIS — M9901 Segmental and somatic dysfunction of cervical region: Secondary | ICD-10-CM | POA: Diagnosis not present

## 2022-05-16 DIAGNOSIS — M546 Pain in thoracic spine: Secondary | ICD-10-CM | POA: Diagnosis not present

## 2022-05-16 DIAGNOSIS — M9903 Segmental and somatic dysfunction of lumbar region: Secondary | ICD-10-CM | POA: Diagnosis not present

## 2022-05-16 DIAGNOSIS — M9902 Segmental and somatic dysfunction of thoracic region: Secondary | ICD-10-CM | POA: Diagnosis not present

## 2022-05-16 DIAGNOSIS — M542 Cervicalgia: Secondary | ICD-10-CM | POA: Diagnosis not present

## 2022-05-23 DIAGNOSIS — G4733 Obstructive sleep apnea (adult) (pediatric): Secondary | ICD-10-CM | POA: Diagnosis not present

## 2022-06-06 DIAGNOSIS — G4733 Obstructive sleep apnea (adult) (pediatric): Secondary | ICD-10-CM | POA: Diagnosis not present

## 2022-06-13 DIAGNOSIS — M9903 Segmental and somatic dysfunction of lumbar region: Secondary | ICD-10-CM | POA: Diagnosis not present

## 2022-06-13 DIAGNOSIS — M546 Pain in thoracic spine: Secondary | ICD-10-CM | POA: Diagnosis not present

## 2022-06-13 DIAGNOSIS — M542 Cervicalgia: Secondary | ICD-10-CM | POA: Diagnosis not present

## 2022-06-13 DIAGNOSIS — M9902 Segmental and somatic dysfunction of thoracic region: Secondary | ICD-10-CM | POA: Diagnosis not present

## 2022-06-13 DIAGNOSIS — M9901 Segmental and somatic dysfunction of cervical region: Secondary | ICD-10-CM | POA: Diagnosis not present

## 2022-07-09 DIAGNOSIS — M9902 Segmental and somatic dysfunction of thoracic region: Secondary | ICD-10-CM | POA: Diagnosis not present

## 2022-07-09 DIAGNOSIS — M9901 Segmental and somatic dysfunction of cervical region: Secondary | ICD-10-CM | POA: Diagnosis not present

## 2022-07-09 DIAGNOSIS — M542 Cervicalgia: Secondary | ICD-10-CM | POA: Diagnosis not present

## 2022-07-09 DIAGNOSIS — M546 Pain in thoracic spine: Secondary | ICD-10-CM | POA: Diagnosis not present

## 2022-07-09 DIAGNOSIS — M9903 Segmental and somatic dysfunction of lumbar region: Secondary | ICD-10-CM | POA: Diagnosis not present

## 2022-07-20 DIAGNOSIS — U071 COVID-19: Secondary | ICD-10-CM | POA: Diagnosis not present

## 2022-07-26 DIAGNOSIS — G4733 Obstructive sleep apnea (adult) (pediatric): Secondary | ICD-10-CM | POA: Diagnosis not present

## 2022-08-06 DIAGNOSIS — M546 Pain in thoracic spine: Secondary | ICD-10-CM | POA: Diagnosis not present

## 2022-08-06 DIAGNOSIS — M9901 Segmental and somatic dysfunction of cervical region: Secondary | ICD-10-CM | POA: Diagnosis not present

## 2022-08-06 DIAGNOSIS — M9902 Segmental and somatic dysfunction of thoracic region: Secondary | ICD-10-CM | POA: Diagnosis not present

## 2022-08-06 DIAGNOSIS — M9903 Segmental and somatic dysfunction of lumbar region: Secondary | ICD-10-CM | POA: Diagnosis not present

## 2022-08-06 DIAGNOSIS — M542 Cervicalgia: Secondary | ICD-10-CM | POA: Diagnosis not present

## 2022-09-03 DIAGNOSIS — M9901 Segmental and somatic dysfunction of cervical region: Secondary | ICD-10-CM | POA: Diagnosis not present

## 2022-09-03 DIAGNOSIS — M542 Cervicalgia: Secondary | ICD-10-CM | POA: Diagnosis not present

## 2022-09-03 DIAGNOSIS — M9902 Segmental and somatic dysfunction of thoracic region: Secondary | ICD-10-CM | POA: Diagnosis not present

## 2022-09-03 DIAGNOSIS — M9903 Segmental and somatic dysfunction of lumbar region: Secondary | ICD-10-CM | POA: Diagnosis not present

## 2022-09-03 DIAGNOSIS — M546 Pain in thoracic spine: Secondary | ICD-10-CM | POA: Diagnosis not present

## 2022-09-05 DIAGNOSIS — G4733 Obstructive sleep apnea (adult) (pediatric): Secondary | ICD-10-CM | POA: Diagnosis not present

## 2022-09-14 ENCOUNTER — Emergency Department (HOSPITAL_COMMUNITY): Payer: 59

## 2022-09-14 ENCOUNTER — Other Ambulatory Visit: Payer: Self-pay

## 2022-09-14 ENCOUNTER — Emergency Department (HOSPITAL_COMMUNITY)
Admission: EM | Admit: 2022-09-14 | Discharge: 2022-09-14 | Disposition: A | Discharge status: Home / Self Care | Payer: 59 | Attending: Emergency Medicine | Admitting: Emergency Medicine

## 2022-09-14 DIAGNOSIS — R1030 Lower abdominal pain, unspecified: Secondary | ICD-10-CM | POA: Diagnosis not present

## 2022-09-14 DIAGNOSIS — Z7984 Long term (current) use of oral hypoglycemic drugs: Secondary | ICD-10-CM | POA: Diagnosis not present

## 2022-09-14 DIAGNOSIS — R109 Unspecified abdominal pain: Secondary | ICD-10-CM | POA: Diagnosis not present

## 2022-09-14 DIAGNOSIS — E119 Type 2 diabetes mellitus without complications: Secondary | ICD-10-CM | POA: Insufficient documentation

## 2022-09-14 DIAGNOSIS — R197 Diarrhea, unspecified: Secondary | ICD-10-CM | POA: Insufficient documentation

## 2022-09-14 DIAGNOSIS — R112 Nausea with vomiting, unspecified: Secondary | ICD-10-CM | POA: Insufficient documentation

## 2022-09-14 DIAGNOSIS — Z20822 Contact with and (suspected) exposure to covid-19: Secondary | ICD-10-CM | POA: Insufficient documentation

## 2022-09-14 DIAGNOSIS — R748 Abnormal levels of other serum enzymes: Secondary | ICD-10-CM | POA: Insufficient documentation

## 2022-09-14 DIAGNOSIS — R0602 Shortness of breath: Secondary | ICD-10-CM | POA: Diagnosis not present

## 2022-09-14 LAB — CBC
HCT: 45.1 % (ref 39.0–52.0)
Hemoglobin: 15 g/dL (ref 13.0–17.0)
MCH: 27.5 pg (ref 26.0–34.0)
MCHC: 33.3 g/dL (ref 30.0–36.0)
MCV: 82.6 fL (ref 80.0–100.0)
Platelets: 181 10*3/uL (ref 150–400)
RBC: 5.46 MIL/uL (ref 4.22–5.81)
RDW: 14.4 % (ref 11.5–15.5)
WBC: 5.6 10*3/uL (ref 4.0–10.5)
nRBC: 0 % (ref 0.0–0.2)

## 2022-09-14 LAB — COMPREHENSIVE METABOLIC PANEL
ALT: 46 U/L — ABNORMAL HIGH (ref 0–44)
AST: 36 U/L (ref 15–41)
Albumin: 3.8 g/dL (ref 3.5–5.0)
Alkaline Phosphatase: 45 U/L (ref 38–126)
Anion gap: 8 (ref 5–15)
BUN: 9 mg/dL (ref 6–20)
CO2: 25 mmol/L (ref 22–32)
Calcium: 8.4 mg/dL — ABNORMAL LOW (ref 8.9–10.3)
Chloride: 101 mmol/L (ref 98–111)
Creatinine, Ser: 1.08 mg/dL (ref 0.61–1.24)
GFR, Estimated: >60 mL/min (ref >60)
Glucose, Bld: 115 mg/dL — ABNORMAL HIGH (ref 70–99)
Potassium: 3.4 mmol/L — ABNORMAL LOW (ref 3.5–5.1)
Sodium: 134 mmol/L — ABNORMAL LOW (ref 135–145)
Total Bilirubin: 0.6 mg/dL (ref 0.3–1.2)
Total Protein: 6.8 g/dL (ref 6.5–8.1)

## 2022-09-14 LAB — RESP PANEL BY RT-PCR (RSV, FLU A&B, COVID)  RVPGX2
Influenza A by PCR: NEGATIVE
Influenza B by PCR: NEGATIVE
Resp Syncytial Virus by PCR: NEGATIVE
SARS Coronavirus 2 by RT PCR: NEGATIVE

## 2022-09-14 LAB — LIPASE, BLOOD: Lipase: 59 U/L — ABNORMAL HIGH (ref 11–51)

## 2022-09-14 MED ORDER — DICYCLOMINE HCL 20 MG PO TABS: ORAL_TABLET | ORAL | 0 refills | Status: DC

## 2022-09-14 MED ORDER — IOHEXOL 300 MG/ML  SOLN
100.0000 mL | Freq: Once | INTRAMUSCULAR | Status: AC | PRN
  Administered 2022-09-14: 100 mL via INTRAVENOUS

## 2022-09-14 NOTE — ED Provider Notes (Signed)
[facility] Provider Note   CSN: [id number] Arrival date & time: 09/14/22  0701     History  Chief Complaint  Patient presents with   Abdominal Pain    Edwin Norton is a 52 y.o. male.  Patient states that he had some nausea and vomiting and diarrhea this week.  Patient has a history of diabetes  The history is provided by the patient and medical records. No language interpreter was used.  Abdominal Pain Pain location:  Generalized Pain quality: aching   Pain severity:  Mild Onset quality:  Sudden Progression:  Worsening Chronicity:  Recurrent Context: not alcohol use   Relieved by:  Nothing Worsened by:  Nothing Ineffective treatments:  None tried Associated symptoms: no anorexia, no chest pain, no cough, no diarrhea, no fatigue and no hematuria   Risk factors: no alcohol abuse        Home Medications Prior to Admission medications   Medication Sig Start Date End Date Taking? Authorizing Provider  dicyclomine (BENTYL) 20 MG tablet Take 1 every 8 hours as needed for abdominal cramping 09/14/22  Yes Milton Ferguson, MD  ALPRAZolam Duanne Moron) 1 MG tablet Take 0.5-1 tablets by mouth 2 (two) times daily.  08/22/16   [provider]  atorvastatin (LIPITOR) 40 MG tablet Take 40 mg by mouth at bedtime.    [provider]  Coenzyme Q10 (COQ-10 PO) Take by mouth at bedtime.    [provider]  glimepiride (AMARYL) 2 MG tablet Take 2 mg by mouth at bedtime.    [provider]  Astrid Drafts 500 MG CAPS Take by mouth at bedtime.    [provider]  metFORMIN (GLUCOPHAGE) 500 MG tablet Take by mouth 2 (two) times daily.    [provider]  Multiple Vitamins-Minerals (ICAPS PO) Take by mouth daily.    [provider]  omeprazole (PRILOSEC) 40 MG capsule Take 40 mg by mouth at bedtime.    [provider]      Allergies    Morphine and related, Percocet  [oxycodone-acetaminophen], and Vicodin [hydrocodone-acetaminophen]    Review of Systems   Review of Systems  Constitutional:  Negative for appetite change and fatigue.  HENT:  Negative for congestion, ear discharge and sinus pressure.   Eyes:  Negative for discharge.  Respiratory:  Negative for cough.   Cardiovascular:  Negative for chest pain.  Gastrointestinal:  Positive for abdominal pain. Negative for anorexia and diarrhea.  Genitourinary:  Negative for frequency and hematuria.  Musculoskeletal:  Negative for back pain.  Skin:  Negative for rash.  Neurological:  Negative for seizures and headaches.  Psychiatric/Behavioral:  Negative for hallucinations.     Physical Exam Updated Vital Signs BP 114/71   Pulse 82   Temp 98.4 F (36.9 C) (Oral)   Resp 17   Ht 5\' 6"  (1.676 m)   Wt 111.1 kg   SpO2 96%   BMI 39.54 kg/m  Physical Exam Vitals and nursing note reviewed.  Constitutional:      Appearance: He is well-developed.  HENT:     Head: Normocephalic.     Nose: Nose normal.  Eyes:     General: No scleral icterus.    Conjunctiva/sclera: Conjunctivae normal.  Neck:     Thyroid: No thyromegaly.  Cardiovascular:     Rate and Rhythm: Normal rate and regular rhythm.     Heart sounds: No murmur heard.    No friction rub. No gallop.  Pulmonary:     Breath sounds: No stridor. No wheezing or rales.  Chest:     Chest wall: No tenderness.  Abdominal:     General: There is no distension.     Tenderness: There is no abdominal tenderness. There is no rebound.  Musculoskeletal:        General: Normal range of motion.     Cervical back: Neck supple.  Lymphadenopathy:     Cervical: No cervical adenopathy.  Skin:    Findings: No erythema or rash.  Neurological:     Mental Status: He is alert and oriented to person, place, and time.     Motor: No abnormal muscle tone.     Coordination: Coordination normal.  Psychiatric:        Behavior: Behavior normal.     ED Results /  Procedures / Treatments   Labs (all labs ordered are listed, but only abnormal results are displayed) Labs Reviewed  LIPASE, BLOOD - Abnormal; Notable for the following components:      Result Value   Lipase 59 (*)    All other components within normal limits  COMPREHENSIVE METABOLIC PANEL - Abnormal; Notable for the following components:   Sodium 134 (*)    Potassium 3.4 (*)    Glucose, Bld 115 (*)    Calcium 8.4 (*)    ALT 46 (*)    All other components within normal limits  RESP PANEL BY RT-PCR (RSV, FLU A&B, COVID)  RVPGX2  CBC  URINALYSIS, ROUTINE W REFLEX MICROSCOPIC    EKG None  Radiology CT ABDOMEN PELVIS W CONTRAST  Result Date: 09/14/2022 CLINICAL DATA:  Abdominal pain. EXAM: CT ABDOMEN AND PELVIS WITH CONTRAST TECHNIQUE: Multidetector CT imaging of the abdomen and pelvis was performed using the standard protocol following bolus administration of intravenous contrast. RADIATION DOSE REDUCTION: This exam was performed according to the departmental dose-optimization program which includes automated exposure control, adjustment of the mA and/or kV according to patient size and/or use of iterative reconstruction technique. CONTRAST:  156mL OMNIPAQUE IOHEXOL 300 MG/ML  SOLN COMPARISON:  CT 12/03/2016 and older.  Ultrasound 04/09/2019 FINDINGS: Lower chest: Lung bases are clear.  No pleural effusion. Hepatobiliary: Diffuse fatty liver infiltration. Area of fatty sparing along the margin of the gallbladder. Gallbladder itself is nondilated. Patent portal vein. Pancreas: Unremarkable. No pancreatic ductal dilatation or surrounding inflammatory changes. Spleen: Normal in size without focal abnormality. Adrenals/Urinary Tract: The adrenal glands are preserved. No enhancing renal mass or collecting system dilatation. The ureters have normal course and caliber down to the bladder. Preserved contours of the urinary bladder. Stomach/Bowel: The large bowel has a normal course and caliber.  Moderate colonic stool. Normal appendix extends medial to the cecum in the right lower quadrant. There is some debris in the stomach. The small bowel is nondilated. Vascular/Lymphatic: Mild vascular calcifications are identified. 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 No specific abnormal lymph node enlargement identified in the abdomen and pelvis. Reproductive: Prostate is unremarkable. Other: No abdominal wall hernia or abnormality. No abdominopelvic ascites. Musculoskeletal: Mild degenerative changes along the spine. Multilevel Schmorl's node deformity. IMPRESSION: Moderate colonic stool. Normal appendix. No obstruction, free air or free fluid. Fatty liver infiltration. Electronically Signed   By: Jill Side M.D.   On: 09/14/2022 10:15   DG Chest Port 1 View  Result Date: 09/14/2022 CLINICAL DATA:  Shortness of breath. EXAM: PORTABLE CHEST 1 VIEW COMPARISON:  Chest x-ray 07/18/2020. FINDINGS: The heart  size and mediastinal contours are within normal limits. Both lungs are clear. No visible pleural effusions or pneumothorax. No acute osseous abnormality. IMPRESSION: No active disease. Electronically Signed   By: Margaretha Sheffield M.D.   On: 09/14/2022 09:14    Procedures Procedures    Medications Ordered in ED Medications  iohexol (OMNIPAQUE) 300 MG/ML solution 100 mL (100 mLs Intravenous Contrast Given 09/14/22 0949)    ED Course/ Medical Decision Making/ A&P                             Medical Decision Making Amount and/or Complexity of Data Reviewed Labs: ordered. Radiology: ordered.  Risk Prescription drug management.  This patient presents to the ED for concern of nausea vomiting abdominal pain, this involves an extensive number of treatment options, and is a complaint that carries with it a high risk of complications and morbidity.  The differential diagnosis includes cholecystitis, gastroenteritis   Co morbidities that complicate the patient  evaluation  Diabetes   Additional history obtained:  Additional history obtained from patient External records from outside source obtained and reviewed including hospital records   Lab Tests:  I Ordered, and personally interpreted labs.  The pertinent results include: WBC normal.  Lipase mildly elevated at 59    Imaging Studies ordered:  I ordered imaging studies including CT abdomen I independently visualized and interpreted imaging which showed moderate stool I agree with the radiologist interpretation   Cardiac Monitoring: / EKG:  The patient was maintained on a cardiac monitor.  I personally viewed and interpreted the cardiac monitored which showed an underlying rhythm of: Normal sinus rhythm   Consultations Obtained:  No consultant Problem List / ED Course / Critical interventions / Medication management  Diabetes and constipation No medicines ordered Reevaluation of the patient after these medicines showed that the patient improved I have reviewed the patients home medicines and have made adjustments as needed   Social Determinants of Health:  None   Test / Admission - Considered:  None  Patient with abdominal cramping.  CT scan unremarkable except for mild constipation.  Patient placed on Bentyl will follow-up with PCP        Final Clinical Impression(s) / ED Diagnoses Final diagnoses:  Lower abdominal pain    Rx / DC Orders ED Discharge Orders          Ordered    dicyclomine (BENTYL) 20 MG tablet        09/14/22 1140              Milton Ferguson, MD 09/14/22 1720

## 2022-09-14 NOTE — ED Triage Notes (Signed)
Pt c/o abd pain , gas, n/v/d and low po intake x 4 days. Pt has pain and knot in abd that feels like it its going into his right groin. Pt was told before it was a fatty deposit but the knot feels bigger now.

## 2022-09-14 NOTE — Discharge Instructions (Signed)
Follow-up with your family doctor next week for recheck. 

## 2022-10-05 DIAGNOSIS — M546 Pain in thoracic spine: Secondary | ICD-10-CM | POA: Diagnosis not present

## 2022-10-05 DIAGNOSIS — M9902 Segmental and somatic dysfunction of thoracic region: Secondary | ICD-10-CM | POA: Diagnosis not present

## 2022-10-05 DIAGNOSIS — M9901 Segmental and somatic dysfunction of cervical region: Secondary | ICD-10-CM | POA: Diagnosis not present

## 2022-10-05 DIAGNOSIS — M9903 Segmental and somatic dysfunction of lumbar region: Secondary | ICD-10-CM | POA: Diagnosis not present

## 2022-10-05 DIAGNOSIS — M542 Cervicalgia: Secondary | ICD-10-CM | POA: Diagnosis not present

## 2022-10-11 DIAGNOSIS — G4733 Obstructive sleep apnea (adult) (pediatric): Secondary | ICD-10-CM | POA: Diagnosis not present

## 2022-10-12 DIAGNOSIS — M542 Cervicalgia: Secondary | ICD-10-CM | POA: Diagnosis not present

## 2022-10-12 DIAGNOSIS — M9901 Segmental and somatic dysfunction of cervical region: Secondary | ICD-10-CM | POA: Diagnosis not present

## 2022-10-12 DIAGNOSIS — M9903 Segmental and somatic dysfunction of lumbar region: Secondary | ICD-10-CM | POA: Diagnosis not present

## 2022-10-12 DIAGNOSIS — M9902 Segmental and somatic dysfunction of thoracic region: Secondary | ICD-10-CM | POA: Diagnosis not present

## 2022-10-12 DIAGNOSIS — M546 Pain in thoracic spine: Secondary | ICD-10-CM | POA: Diagnosis not present

## 2022-10-29 DIAGNOSIS — M542 Cervicalgia: Secondary | ICD-10-CM | POA: Diagnosis not present

## 2022-10-29 DIAGNOSIS — M9901 Segmental and somatic dysfunction of cervical region: Secondary | ICD-10-CM | POA: Diagnosis not present

## 2022-10-29 DIAGNOSIS — M546 Pain in thoracic spine: Secondary | ICD-10-CM | POA: Diagnosis not present

## 2022-10-29 DIAGNOSIS — M9903 Segmental and somatic dysfunction of lumbar region: Secondary | ICD-10-CM | POA: Diagnosis not present

## 2022-10-29 DIAGNOSIS — M9902 Segmental and somatic dysfunction of thoracic region: Secondary | ICD-10-CM | POA: Diagnosis not present

## 2022-11-14 DIAGNOSIS — M9901 Segmental and somatic dysfunction of cervical region: Secondary | ICD-10-CM | POA: Diagnosis not present

## 2022-11-14 DIAGNOSIS — M542 Cervicalgia: Secondary | ICD-10-CM | POA: Diagnosis not present

## 2022-11-14 DIAGNOSIS — M9902 Segmental and somatic dysfunction of thoracic region: Secondary | ICD-10-CM | POA: Diagnosis not present

## 2022-11-14 DIAGNOSIS — M9903 Segmental and somatic dysfunction of lumbar region: Secondary | ICD-10-CM | POA: Diagnosis not present

## 2022-11-14 DIAGNOSIS — M546 Pain in thoracic spine: Secondary | ICD-10-CM | POA: Diagnosis not present

## 2022-11-16 DIAGNOSIS — B359 Dermatophytosis, unspecified: Secondary | ICD-10-CM | POA: Diagnosis not present

## 2022-11-27 DIAGNOSIS — G43B0 Ophthalmoplegic migraine, not intractable: Secondary | ICD-10-CM | POA: Diagnosis not present

## 2022-11-27 DIAGNOSIS — E119 Type 2 diabetes mellitus without complications: Secondary | ICD-10-CM | POA: Diagnosis not present

## 2022-11-27 DIAGNOSIS — H524 Presbyopia: Secondary | ICD-10-CM | POA: Diagnosis not present

## 2022-12-12 DIAGNOSIS — M9903 Segmental and somatic dysfunction of lumbar region: Secondary | ICD-10-CM | POA: Diagnosis not present

## 2022-12-12 DIAGNOSIS — M546 Pain in thoracic spine: Secondary | ICD-10-CM | POA: Diagnosis not present

## 2022-12-12 DIAGNOSIS — M9901 Segmental and somatic dysfunction of cervical region: Secondary | ICD-10-CM | POA: Diagnosis not present

## 2022-12-12 DIAGNOSIS — M9902 Segmental and somatic dysfunction of thoracic region: Secondary | ICD-10-CM | POA: Diagnosis not present

## 2022-12-12 DIAGNOSIS — M542 Cervicalgia: Secondary | ICD-10-CM | POA: Diagnosis not present

## 2023-01-09 DIAGNOSIS — M546 Pain in thoracic spine: Secondary | ICD-10-CM | POA: Diagnosis not present

## 2023-01-09 DIAGNOSIS — M9901 Segmental and somatic dysfunction of cervical region: Secondary | ICD-10-CM | POA: Diagnosis not present

## 2023-01-09 DIAGNOSIS — M9902 Segmental and somatic dysfunction of thoracic region: Secondary | ICD-10-CM | POA: Diagnosis not present

## 2023-01-09 DIAGNOSIS — M542 Cervicalgia: Secondary | ICD-10-CM | POA: Diagnosis not present

## 2023-01-09 DIAGNOSIS — M9903 Segmental and somatic dysfunction of lumbar region: Secondary | ICD-10-CM | POA: Diagnosis not present

## 2023-02-06 DIAGNOSIS — M9903 Segmental and somatic dysfunction of lumbar region: Secondary | ICD-10-CM | POA: Diagnosis not present

## 2023-02-06 DIAGNOSIS — M9902 Segmental and somatic dysfunction of thoracic region: Secondary | ICD-10-CM | POA: Diagnosis not present

## 2023-02-06 DIAGNOSIS — M542 Cervicalgia: Secondary | ICD-10-CM | POA: Diagnosis not present

## 2023-02-06 DIAGNOSIS — M9901 Segmental and somatic dysfunction of cervical region: Secondary | ICD-10-CM | POA: Diagnosis not present

## 2023-02-06 DIAGNOSIS — M546 Pain in thoracic spine: Secondary | ICD-10-CM | POA: Diagnosis not present

## 2023-02-08 DIAGNOSIS — G4733 Obstructive sleep apnea (adult) (pediatric): Secondary | ICD-10-CM | POA: Diagnosis not present

## 2023-02-17 NOTE — Progress Notes (Signed)
Primary Care Physician:  Assunta Found, MD Primary Gastroenterologist:  Dr. Marletta Lor  Chief Complaint  Patient presents with   Abdominal Pain         HPI:   Edwin Norton is a 52 y.o. male presenting today with chief complaint of bilateral costal margin pain.   Today:  Pain on bilateral flanks at his ribs. Started 2-3 months ago. Left side possibly from leaning over a truck or cough. Has had a cough for a while. Post nasal drip. Was smoking, but stopped. No postprandial symptoms. Only notices it with certain movements/cough. No nausea or vomiting. GERD is well controlled. Intermittent dysphagia. Feels foods are sitting in lower chest.  Occurs with solid foods.  No trouble with soft foods or liquids.  Has postprandial bowel movements. No cramping, just gas pain that resolves a BM. Has at least 3 Bms daily can be up to 7.  No melena. Every now and then has blood on toilet tissue. Has hemorrhoids. No prior colonoscopy.    No Fhx of IBD or colon cancer.     Past Medical History:  Diagnosis Date   Anxiety    Complication of anesthesia 2005   does not recall name of meds. but makes his ears ring   Depression    Diabetes (HCC)    Dizziness    GERD (gastroesophageal reflux disease)    History of nephrolithiasis    HLD (hyperlipidemia)    Meniere disease    Shortness of breath     Past Surgical History:  Procedure Laterality Date   ANAL FISSURE REPAIR  Sept 2001   Dr. Theodis Sato, [facility]   ANAL FISSURE REPAIR  11/14/2011   Procedure: ANAL FISSURE REPAIR;  Surgeon: Dalia Heading, MD;  Location: [facility];  Service: General;  Laterality: N/A;  Excision Skin Tag of Anus   BRONCHOSCOPY  08/23/06   erythema was found in the left lower lobe   debridment of chronic granulation tissue and seton placement  Feb 2002   Dr. Theodis Sato, [facility], noted suprasphincteric fistula, with seton placement    ESOPHAGOGASTRODUODENOSCOPY  04/15/07   patchy erythema in antrum,  negative H.pylori    EXTERNAL EAR SURGERY     right   HEMORROIDECTOMY      Current Outpatient Medications  Medication Sig Dispense Refill   ALPRAZolam (XANAX) 1 MG tablet Take 0.5-1 tablets by mouth 2 (two) times daily.      atorvastatin (LIPITOR) 40 MG tablet Take 40 mg by mouth at bedtime.     CINNAMON PO Take 1,000 mg by mouth.     Coenzyme Q10 (COQ-10 PO) Take by mouth at bedtime.     empagliflozin (JARDIANCE) 25 MG TABS tablet Take 25 mg by mouth daily.     fenofibrate 160 MG tablet Take 160 mg by mouth daily.     metFORMIN (GLUCOPHAGE) 500 MG tablet Take by mouth 2 (two) times daily.     Multiple Vitamins-Minerals (ICAPS PO) Take by mouth daily.     omeprazole (PRILOSEC) 40 MG capsule Take 40 mg by mouth at bedtime.     rifaximin (XIFAXAN) 550 MG TABS tablet Take 1 tablet (550 mg total) by mouth 3 (three) times daily for 14 days. 42 tablet 0   No current facility-administered medications for this visit.    Allergies as of 02/18/2023 - Review Complete 02/18/2023  Allergen Reaction Noted   Morphine and codeine  11/29/2016   Percocet [oxycodone-acetaminophen] Itching 05/13/2011  Vicodin [hydrocodone-acetaminophen] Itching 05/13/2011    Family History  Problem Relation Age of Onset   Diabetes Other    Colon polyps Mother    Colon cancer Neg Hx     Social History   Socioeconomic History   Marital status: Single    Spouse name: Not on file   Number of children: Not on file   Years of education: 8   Highest education level: Not on file  Occupational History    Employer: NOT EMPLOYED  Tobacco Use   Smoking status: Former    Types: Cigarettes   Smokeless tobacco: Current    Types: Snuff  Substance and Sexual Activity   Alcohol use: No   Drug use: No   Sexual activity: Not on file  Other Topics Concern   Not on file  Social History Narrative   Not on file   Social Determinants of Health   Financial Resource Strain: Not on file  Food Insecurity: Not on file   Transportation Needs: Not on file  Physical Activity: Not on file  Stress: Not on file  Social Connections: Not on file  Intimate Partner Violence: Not on file    Review of Systems: Gen: Denies any fever, chills, cold or flulike symptoms, presyncope, syncope. CV: Denies chest pain, heart palpitations. Resp: Denies shortness of breath.  Admits to cough. GI: See HPI GU : Denies urinary burning, urinary frequency, urinary hesitancy MS: Denies joint pain. Derm: Denies rash. Psych: Denies depression, anxiety. Heme: See HPI  Physical Exam: BP 127/80 (BP Location: Left Arm, Patient Position: Sitting, Cuff Size: Large)   Pulse 60   Temp 98.4 F (36.9 C) (Temporal)   Ht 5\' 6"  (1.676 m)   Wt 256 lb 12.8 oz (116.5 kg)   SpO2 96%   BMI 41.45 kg/m  General:   Alert and oriented. Pleasant and cooperative. Well-nourished and well-developed.  Head:  Normocephalic and atraumatic. Eyes:  Without icterus, sclera clear and conjunctiva pink.  Ears:  Normal auditory acuity. Lungs:  Clear to auscultation bilaterally. No wheezes, rales, or rhonchi. No distress.  Heart:  S1, S2 present without murmurs appreciated.  Abdomen:  +BS, soft, non-tender and non-distended. He does have mild tenderness along bilateral costal margin. No HSM noted. No guarding or rebound. No masses appreciated.  Rectal:  Deferred  Msk:  Symmetrical without gross deformities. Normal posture. Extremities:  Without edema. Neurologic:  Alert and  oriented x4;  grossly normal neurologically. Skin:  Intact without significant lesions or rashes. Psych:  Normal mood and affect.    Assessment:  52 year old male with history of diabetes, Mnire's disease, HLD, anxiety, depression, GERD, fatty liver, presenting today with chief complaint of bilateral costal margin pain.  Also reporting dysphagia, postprandial bowel movements, rectal bleeding.  Costal margin pain: Bilateral.  Likely MSK etiology in the setting of chronic cough  related to postnasal drainage.  He does not have any postprandial symptoms or other significant upper GI symptoms aside from dysphagia as discussed below.  He does not have any abdominal tenderness on exam, only tenderness when palpating the costal margin.   Dysphagia: Intermittent solid food dysphagia with sensation of food sitting in the lower chest.  He does have chronic GERD, but this is well-controlled on daily PPI.  He has not had an EGD in many years.  Will plan for EGD for further evaluation and therapeutic intervention with differentials including esophageal web, ring, stricture, and less likely malignancy.  Loose stools/bloating/rectal bleeding: Postprandial.  May be related  to IBS versus SIBO.   His symptoms do not seem consistent with IBD. He does report some intermittent, rare toilet tissue hematochezia which I suspect is more likely related to benign anorectal source such as hemorrhoids, but he will need a colonoscopy for further evaluation to rule out colon polyps or malignancy as has never had a colonoscopy.  For suspected IBS, we will try treating him with Xifaxan.   Fatty liver/elevated ALT: History of MASLD. No alcohol use.  Recent CT in March redemonstrating fatty liver.  Normal spleen.  Recent labs in March with slight elevation of ALT at 46, platelets normal.  He does not have any signs or symptoms of decompensated liver disease.  Will plan to update labs as well as RUQ ultrasound with elastography. If persistent LFT elevation, will need to consider additional work-up.  He was counseled on fatty liver.   Plan:  CBC, CMP [place] RUQ with elastography  Proceed with colonoscopy + EGD +/- dilation with propofol by Dr. Marletta Lor in near future. The risks, benefits, and alternatives have been discussed with the patient in detail. The patient states understanding and desires to proceed.  ASA 3 Hold Jardiance for 3 days prior to your procedures. No diabetes medications morning of  procedures. Continue omeprazole 40 mg daily. Xifaxan 550 mg TID x 14 days.  Instructions for fatty liver: Recommend 1-2# weight loss per week until ideal body weight through exercise & diet. Low fat/cholesterol diet.   Avoid sweets, sodas, fruit juices, sweetened beverages like tea, etc. Gradually increase exercise from 15 min daily up to 1 hr per day 5 days/week. Continue to limit/ avoid alcohol use. Follow-up after procedures.    Ermalinda Memos, PA-C [facility] 02/18/2023

## 2023-02-18 ENCOUNTER — Telehealth: Payer: Self-pay | Admitting: *Deleted

## 2023-02-18 ENCOUNTER — Encounter: Payer: Self-pay | Admitting: *Deleted

## 2023-02-18 ENCOUNTER — Encounter: Payer: Self-pay | Admitting: Gastroenterology

## 2023-02-18 ENCOUNTER — Ambulatory Visit (INDEPENDENT_AMBULATORY_CARE_PROVIDER_SITE_OTHER): Payer: 59 | Admitting: Gastroenterology

## 2023-02-18 ENCOUNTER — Other Ambulatory Visit: Payer: Self-pay | Admitting: *Deleted

## 2023-02-18 VITALS — BP 127/80 | HR 60 | Temp 98.4°F | Ht 66.0 in | Wt 256.8 lb

## 2023-02-18 DIAGNOSIS — K625 Hemorrhage of anus and rectum: Secondary | ICD-10-CM | POA: Diagnosis not present

## 2023-02-18 DIAGNOSIS — K760 Fatty (change of) liver, not elsewhere classified: Secondary | ICD-10-CM | POA: Diagnosis not present

## 2023-02-18 DIAGNOSIS — R0781 Pleurodynia: Secondary | ICD-10-CM

## 2023-02-18 DIAGNOSIS — R140 Abdominal distension (gaseous): Secondary | ICD-10-CM | POA: Diagnosis not present

## 2023-02-18 DIAGNOSIS — R1310 Dysphagia, unspecified: Secondary | ICD-10-CM | POA: Diagnosis not present

## 2023-02-18 DIAGNOSIS — R194 Change in bowel habit: Secondary | ICD-10-CM | POA: Insufficient documentation

## 2023-02-18 DIAGNOSIS — R195 Other fecal abnormalities: Secondary | ICD-10-CM | POA: Diagnosis not present

## 2023-02-18 MED ORDER — PEG 3350-KCL-NA BICARB-NACL 420 G PO SOLR: 4000.0000 mL | Freq: Once | ORAL | 0 refills | Status: AC

## 2023-02-18 MED ORDER — RIFAXIMIN 550 MG PO TABS: 550.0000 mg | ORAL_TABLET | Freq: Three times a day (TID) | ORAL | 0 refills | Status: AC

## 2023-02-18 NOTE — Telephone Encounter (Signed)
[employer] PA: Notification or Prior Authorization is not required for the requested services You are not required to submit a notification/prior authorization based on the information provided. If you have general questions about the prior authorization requirements, visit [email or web address] > Clinician Resources > Advance and Admission Notification Requirements. The number above acknowledges your notification. Please write this reference number down for future reference. If you would like to request an organization determination, please call us at [phone]. Decision ID #: [id number]

## 2023-02-18 NOTE — Patient Instructions (Addendum)
We will arrange for you to have an upper endoscopy with possible stretching of your esophagus and colonoscopy in the near future with Dr. Marletta Lor.  You will need to hold Jardiance for 3 days prior to your procedure.  You may take metformin as prescribed the day prior to procedure.  The day of your procedure, do not take any morning diabetes medications.  Please have blood work completed at [facility].  We will arrange to have an ultrasound of your abdomen at [facility].  Start Xifaxan 550 mg 3 times daily for 14 days for possible IBS.  I am sending a prescription to your pharmacy.  Instructions for fatty liver: Recommend 1-2# weight loss per week until ideal body weight through exercise & diet. Low fat/cholesterol diet.   Avoid sweets, sodas, fruit juices, sweetened beverages like tea, etc. Gradually increase exercise from 15 min daily up to 1 hr per day 5 days/week. Limit alcohol use.  Continue omeprazole 40 mg daily.  I will follow-up with you in the office after your procedures.  Do not hesitate to call sooner if you have questions or concerns.  It was great to see you again today!  Ermalinda Memos, PA-C [facility]

## 2023-02-20 ENCOUNTER — Ambulatory Visit (HOSPITAL_COMMUNITY)
Admission: RE | Admit: 2023-02-20 | Discharge: 2023-02-20 | Disposition: A | Discharge status: Home / Self Care | Payer: 59 | Source: Ambulatory Visit | Attending: Gastroenterology | Admitting: Gastroenterology

## 2023-02-20 ENCOUNTER — Encounter: Payer: Self-pay | Admitting: *Deleted

## 2023-02-20 DIAGNOSIS — K760 Fatty (change of) liver, not elsewhere classified: Secondary | ICD-10-CM | POA: Insufficient documentation

## 2023-02-21 LAB — CBC WITH DIFFERENTIAL/PLATELET
Absolute Monocytes: 481 {cells}/uL (ref 200–950)
Basophils Absolute: 41 {cells}/uL (ref 0–200)
Basophils Relative: 0.7 %
Eosinophils Absolute: 70 {cells}/uL (ref 15–500)
Eosinophils Relative: 1.2 %
HCT: 46.8 % (ref 38.5–50.0)
Hemoglobin: 15.7 g/dL (ref 13.2–17.1)
Lymphs Abs: 2250 cells/uL (ref 850–3900)
MCH: 28 pg (ref 27.0–33.0)
MCHC: 33.5 g/dL (ref 32.0–36.0)
MCV: 83.6 fL (ref 80.0–100.0)
MPV: 10.5 fL (ref 7.5–12.5)
Monocytes Relative: 8.3 %
Neutro Abs: 2958 {cells}/uL (ref 1500–7800)
Neutrophils Relative %: 51 %
Platelets: 233 10*3/uL (ref 140–400)
RBC: 5.6 10*6/uL (ref 4.20–5.80)
RDW: 14.6 % (ref 11.0–15.0)
Total Lymphocyte: 38.8 %
WBC: 5.8 10*3/uL (ref 3.8–10.8)

## 2023-02-21 LAB — COMPLETE METABOLIC PANEL WITH GFR
AG Ratio: 1.7 (calc) (ref 1.0–2.5)
ALT: 42 U/L (ref 9–46)
AST: 27 U/L (ref 10–35)
Albumin: 4.7 g/dL (ref 3.6–5.1)
Alkaline phosphatase (APISO): 52 U/L (ref 35–144)
BUN: 13 mg/dL (ref 7–25)
CO2: 25 mmol/L (ref 20–32)
Calcium: 9.7 mg/dL (ref 8.6–10.3)
Chloride: 104 mmol/L (ref 98–110)
Creat: 1.06 mg/dL (ref 0.70–1.30)
Globulin: 2.7 g/dL (ref 1.9–3.7)
Glucose, Bld: 134 mg/dL — ABNORMAL HIGH (ref 65–99)
Potassium: 4 mmol/L (ref 3.5–5.3)
Sodium: 140 mmol/L (ref 135–146)
Total Bilirubin: 0.5 mg/dL (ref 0.2–1.2)
Total Protein: 7.4 g/dL (ref 6.1–8.1)
eGFR: 85 mL/min/{1.73_m2} (ref >=60)

## 2023-02-23 ENCOUNTER — Encounter: Payer: Self-pay | Admitting: Gastroenterology

## 2023-03-11 DIAGNOSIS — M542 Cervicalgia: Secondary | ICD-10-CM | POA: Diagnosis not present

## 2023-03-11 DIAGNOSIS — M546 Pain in thoracic spine: Secondary | ICD-10-CM | POA: Diagnosis not present

## 2023-03-11 DIAGNOSIS — M9902 Segmental and somatic dysfunction of thoracic region: Secondary | ICD-10-CM | POA: Diagnosis not present

## 2023-03-11 DIAGNOSIS — M9903 Segmental and somatic dysfunction of lumbar region: Secondary | ICD-10-CM | POA: Diagnosis not present

## 2023-03-11 DIAGNOSIS — M9901 Segmental and somatic dysfunction of cervical region: Secondary | ICD-10-CM | POA: Diagnosis not present

## 2023-03-18 DIAGNOSIS — M9902 Segmental and somatic dysfunction of thoracic region: Secondary | ICD-10-CM | POA: Diagnosis not present

## 2023-03-18 DIAGNOSIS — M546 Pain in thoracic spine: Secondary | ICD-10-CM | POA: Diagnosis not present

## 2023-03-18 DIAGNOSIS — M542 Cervicalgia: Secondary | ICD-10-CM | POA: Diagnosis not present

## 2023-03-18 DIAGNOSIS — M9901 Segmental and somatic dysfunction of cervical region: Secondary | ICD-10-CM | POA: Diagnosis not present

## 2023-03-18 DIAGNOSIS — M9903 Segmental and somatic dysfunction of lumbar region: Secondary | ICD-10-CM | POA: Diagnosis not present

## 2023-03-19 NOTE — Patient Instructions (Signed)
Edwin Norton  03/19/2023     @PREFPERIOPPHARMACY @   Your procedure is scheduled on  03/25/2023.   Report to [facility] at  0915  A.M.   Call this number if you have problems the morning of surgery:  [phone]  If you experience any cold or flu symptoms such as cough, fever, chills, shortness of breath, etc. between now and your scheduled surgery, please notify us at the above number.   Remember:  Follow the diet and prep instructions given to you by the office.      Your last dose of jardiance should be on 03/21/2023.     Take these medicines the morning of surgery with A SIP OF WATER                             xanax(if needed), omeprazole.     Do not wear jewelry, make-up or nail polish, including gel polish,  artificial nails, or any other type of covering on natural nails (fingers and  toes).  Do not wear lotions, powders, or perfumes, or deodorant.  Do not shave 48 hours prior to surgery.  Men may shave face and neck.  Do not bring valuables to the hospital.  [facility] is not responsible for any belongings or valuables.  Contacts, dentures or bridgework may not be worn into surgery.  Leave your suitcase in the car.  After surgery it may be brought to your room.  For patients admitted to the hospital, discharge time will be determined by your treatment team.  Patients discharged the day of surgery will not be allowed to drive home and must have someone with them for 24 hous.    Special instructions:   DO NOT smoke tobacco or vape for 24 hours before your procedure.  Please read over the following fact sheets that you were given. Anesthesia Post-op Instructions and Care and Recovery After Surgery      Upper Endoscopy, Adult, Care After After the procedure, it is common to have a sore throat. It is also common to have: Mild stomach pain or discomfort. Bloating. Nausea. Follow these instructions at home: The instructions below may help you  care for yourself at home. Your health care provider may give you more instructions. If you have questions, ask your health care provider. If you were given a sedative during the procedure, it can affect you for several hours. Do not drive or operate machinery until your health care provider says that it is safe. If you will be going home right after the procedure, plan to have a responsible adult: Take you home from the hospital or clinic. You will not be allowed to drive. Care for you for the time you are told. Follow instructions from your health care provider about what you may eat and drink. Return to your normal activities as told by your health care provider. Ask your health care provider what activities are safe for you. Take over-the-counter and prescription medicines only as told by your health care provider. Contact a health care provider if you: Have a sore throat that lasts longer than one day. Have trouble swallowing. Have a fever. Get help right away if you: Vomit blood or your vomit looks like coffee grounds. Have bloody, black, or tarry stools. Have a very bad sore throat or you cannot swallow. Have difficulty breathing or very bad pain in your  chest or abdomen. These symptoms may be an emergency. Get help right away. Call 911. Do not wait to see if the symptoms will go away. Do not drive yourself to the hospital. Summary After the procedure, it is common to have a sore throat, mild stomach discomfort, bloating, and nausea. If you were given a sedative during the procedure, it can affect you for several hours. Do not drive until your health care provider says that it is safe. Follow instructions from your health care provider about what you may eat and drink. Return to your normal activities as told by your health care provider. This information is not intended to replace advice given to you by your health care provider. Make sure you discuss any questions you have with your  health care provider. Document Revised: 09/20/2021 Document Reviewed: 09/20/2021 Elsevier Patient Education  2024 [employer] Esophageal Dilatation Esophageal dilatation, also called esophageal dilation, is a procedure to widen or open a blocked or narrowed part of the esophagus. The esophagus is the part of the body that moves food and liquid from the mouth to the stomach. You may need this procedure if: You have a buildup of scar tissue in your esophagus that makes it difficult, painful, or impossible to swallow. This can be caused by gastroesophageal reflux disease (GERD). You have cancer of the esophagus. There is a problem with how food moves through your esophagus. In some cases, you may need this procedure repeated at a later time to dilate the esophagus gradually. Tell a health care provider about: Any allergies you have. All medicines you are taking, including vitamins, herbs, eye drops, creams, and over-the-counter medicines. Any problems you or family members have had with anesthetic medicines. Any blood disorders you have. Any surgeries you have had. Any medical conditions you have. Any antibiotic medicines you are required to take before dental procedures. Whether you are pregnant or may be pregnant. What are the risks? Generally, this is a safe procedure. However, problems may occur, including: Bleeding due to a tear in the lining of the esophagus. A hole, or perforation, in the esophagus. What happens before the procedure? Ask your health care provider about: Changing or stopping your regular medicines. This is especially important if you are taking diabetes medicines or blood thinners. Taking medicines such as aspirin and ibuprofen. These medicines can thin your blood. Do not take these medicines unless your health care provider tells you to take them. Taking over-the-counter medicines, vitamins, herbs, and supplements. Follow instructions from your health care provider  about eating or drinking restrictions. Plan to have a responsible adult take you home from the hospital or clinic. Plan to have a responsible adult care for you for the time you are told after you leave the hospital or clinic. This is important. What happens during the procedure? You may be given a medicine to help you relax (sedative). A numbing medicine may be sprayed into the back of your throat, or you may gargle the medicine. Your health care provider may perform the dilatation using various surgical instruments, such as: Simple dilators. This instrument is carefully placed in the esophagus to stretch it. Guided wire bougies. This involves using an endoscope to insert a wire into the esophagus. A dilator is passed over this wire to enlarge the esophagus. Then the wire is removed. Balloon dilators. An endoscope with a small balloon is inserted into the esophagus. The balloon is inflated to stretch the esophagus and open it up. The procedure may vary  among health care providers and hospitals. What can I expect after the procedure? Your blood pressure, heart rate, breathing rate, and blood oxygen level will be monitored until you leave the hospital or clinic. Your throat may feel slightly sore and numb. This will get better over time. You will not be allowed to eat or drink until your throat is no longer numb. When you are able to drink, urinate, and sit on the edge of the bed without nausea or dizziness, you may be able to return home. Follow these instructions at home: Take over-the-counter and prescription medicines only as told by your health care provider. If you were given a sedative during the procedure, it can affect you for several hours. Do not drive or operate machinery until your health care provider says that it is safe. Plan to have a responsible adult care for you for the time you are told. This is important. Follow instructions from your health care provider about any eating or  drinking restrictions. Do not use any products that contain nicotine or tobacco, such as cigarettes, e-cigarettes, and chewing tobacco. If you need help quitting, ask your health care provider. Keep all follow-up visits. This is important. Contact a health care provider if: You have a fever. You have pain that is not relieved by medicine. Get help right away if: You have chest pain. You have trouble breathing. You have trouble swallowing. You vomit blood. You have black, tarry, or bloody stools. These symptoms may represent a serious problem that is an emergency. Do not wait to see if the symptoms will go away. Get medical help right away. Call your local emergency services (911 in the [place] Do not drive yourself to the hospital. Summary Esophageal dilatation, also called esophageal dilation, is a procedure to widen or open a blocked or narrowed part of the esophagus. Plan to have a responsible adult take you home from the hospital or clinic. For this procedure, a numbing medicine may be sprayed into the back of your throat, or you may gargle the medicine. Do not drive or operate machinery until your health care provider says that it is safe. This information is not intended to replace advice given to you by your health care provider. Make sure you discuss any questions you have with your health care provider. Document Revised: 10/28/2019 Document Reviewed: 10/28/2019 Elsevier Patient Education  2024 [employer] Colonoscopy, Adult, Care After The following information offers guidance on how to care for yourself after your procedure. Your health care provider may also give you more specific instructions. If you have problems or questions, contact your health care provider. What can I expect after the procedure? After the procedure, it is common to have: A small amount of blood in your stool for 24 hours after the procedure. Some gas. Mild cramping or bloating of your abdomen. Follow  these instructions at home: Eating and drinking  Drink enough fluid to keep your urine pale yellow. Follow instructions from your health care provider about eating or drinking restrictions. Resume your normal diet as told by your health care provider. Avoid heavy or fried foods that are hard to digest. Activity Rest as told by your health care provider. Avoid sitting for a long time without moving. Get up to take short walks every 1-2 hours. This is important to improve blood flow and breathing. Ask for help if you feel weak or unsteady. Return to your normal activities as told by your health care provider. Ask your health care  provider what activities are safe for you. Managing cramping and bloating  Try walking around when you have cramps or feel bloated. If directed, apply heat to your abdomen as told by your health care provider. Use the heat source that your health care provider recommends, such as a moist heat pack or a heating pad. Place a towel between your skin and the heat source. Leave the heat on for 20-30 minutes. Remove the heat if your skin turns bright red. This is especially important if you are unable to feel pain, heat, or cold. You have a greater risk of getting burned. General instructions If you were given a sedative during the procedure, it can affect you for several hours. Do not drive or operate machinery until your health care provider says that it is safe. For the first 24 hours after the procedure: Do not sign important documents. Do not drink alcohol. Do your regular daily activities at a slower pace than normal. Eat soft foods that are easy to digest. Take over-the-counter and prescription medicines only as told by your health care provider. Keep all follow-up visits. This is important. Contact a health care provider if: You have blood in your stool 2-3 days after the procedure. Get help right away if: You have more than a small spotting of blood in your  stool. You have large blood clots in your stool. You have swelling of your abdomen. You have nausea or vomiting. You have a fever. You have increasing pain in your abdomen that is not relieved with medicine. These symptoms may be an emergency. Get help right away. Call 911. Do not wait to see if the symptoms will go away. Do not drive yourself to the hospital. Summary After the procedure, it is common to have a small amount of blood in your stool. You may also have mild cramping and bloating of your abdomen. If you were given a sedative during the procedure, it can affect you for several hours. Do not drive or operate machinery until your health care provider says that it is safe. Get help right away if you have a lot of blood in your stool, nausea or vomiting, a fever, or increased pain in your abdomen. This information is not intended to replace advice given to you by your health care provider. Make sure you discuss any questions you have with your health care provider. Document Revised: 07/24/2022 Document Reviewed: 02/01/2021 Elsevier Patient Education  2024 [employer] Monitored Anesthesia Care, Care After The following information offers guidance on how to care for yourself after your procedure. Your health care provider may also give you more specific instructions. If you have problems or questions, contact your health care provider. What can I expect after the procedure? After the procedure, it is common to have: Tiredness. Little or no memory about what happened during or after the procedure. Impaired judgment when it comes to making decisions. Nausea or vomiting. Some trouble with balance. Follow these instructions at home: For the time period you were told by your health care provider:  Rest. Do not participate in activities where you could fall or become injured. Do not drive or use machinery. Do not drink alcohol. Do not take sleeping pills or medicines that cause  drowsiness. Do not make important decisions or sign legal documents. Do not take care of children on your own. Medicines Take over-the-counter and prescription medicines only as told by your health care provider. If you were prescribed antibiotics, take them as told by  your health care provider. Do not stop using the antibiotic even if you start to feel better. Eating and drinking Follow instructions from your health care provider about what you may eat and drink. Drink enough fluid to keep your urine pale yellow. If you vomit: Drink clear fluids slowly and in small amounts as you are able. Clear fluids include water, ice chips, low-calorie sports drinks, and fruit juice that has water added to it (diluted fruit juice). Eat light and bland foods in small amounts as you are able. These foods include bananas, applesauce, rice, lean meats, toast, and crackers. General instructions  Have a responsible adult stay with you for the time you are told. It is important to have someone help care for you until you are awake and alert. If you have sleep apnea, surgery and some medicines can increase your risk for breathing problems. Follow instructions from your health care provider about wearing your sleep device: When you are sleeping. This includes during daytime naps. While taking prescription pain medicines, sleeping medicines, or medicines that make you drowsy. Do not use any products that contain nicotine or tobacco. These products include cigarettes, chewing tobacco, and vaping devices, such as e-cigarettes. If you need help quitting, ask your health care provider. Contact a health care provider if: You feel nauseous or vomit every time you eat or drink. You feel light-headed. You are still sleepy or having trouble with balance after 24 hours. You get a rash. You have a fever. You have redness or swelling around the IV site. Get help right away if: You have trouble breathing. You have new  confusion after you get home. These symptoms may be an emergency. Get help right away. Call 911. Do not wait to see if the symptoms will go away. Do not drive yourself to the hospital. This information is not intended to replace advice given to you by your health care provider. Make sure you discuss any questions you have with your health care provider. Document Revised: 11/06/2021 Document Reviewed: 11/06/2021 Elsevier Patient Education  2024 [employer].

## 2023-03-21 ENCOUNTER — Encounter (HOSPITAL_COMMUNITY): Payer: Self-pay

## 2023-03-21 ENCOUNTER — Encounter (HOSPITAL_COMMUNITY)
Admission: RE | Admit: 2023-03-21 | Discharge: 2023-03-21 | Disposition: A | Discharge status: Home / Self Care | Payer: 59 | Source: Ambulatory Visit | Attending: Internal Medicine | Admitting: Internal Medicine

## 2023-03-21 VITALS — Ht 66.0 in | Wt 256.8 lb

## 2023-03-21 DIAGNOSIS — E119 Type 2 diabetes mellitus without complications: Secondary | ICD-10-CM

## 2023-03-25 NOTE — Telephone Encounter (Signed)
Message Received: Today Edwin Norton, Edwin Norton, Mukhtar Shams L, LPN This patient LM that he needs to reschedule.  Thanks,

## 2023-03-26 ENCOUNTER — Encounter: Payer: Self-pay | Admitting: *Deleted

## 2023-03-26 DIAGNOSIS — G4733 Obstructive sleep apnea (adult) (pediatric): Secondary | ICD-10-CM | POA: Diagnosis not present

## 2023-03-26 NOTE — Telephone Encounter (Signed)
Pt has been rescheduled for 04/29/23. Updated instructions mailed to pt. Pt still has prep from previous scheduled date.

## 2023-03-26 NOTE — Telephone Encounter (Signed)
Attempted to call pt, unable to leave vm due to mailbox being full

## 2023-04-08 DIAGNOSIS — M9901 Segmental and somatic dysfunction of cervical region: Secondary | ICD-10-CM | POA: Diagnosis not present

## 2023-04-08 DIAGNOSIS — M542 Cervicalgia: Secondary | ICD-10-CM | POA: Diagnosis not present

## 2023-04-08 DIAGNOSIS — M9903 Segmental and somatic dysfunction of lumbar region: Secondary | ICD-10-CM | POA: Diagnosis not present

## 2023-04-08 DIAGNOSIS — M9902 Segmental and somatic dysfunction of thoracic region: Secondary | ICD-10-CM | POA: Diagnosis not present

## 2023-04-08 DIAGNOSIS — M546 Pain in thoracic spine: Secondary | ICD-10-CM | POA: Diagnosis not present

## 2023-04-23 NOTE — Patient Instructions (Addendum)
ELIGE STOVES  04/23/2023     @PREFPERIOPPHARMACY @   Your procedure is scheduled on 04/29/23.  Report to [facility] at 1215 P.M.  Call this number if you have problems the morning of surgery:  [phone]  If you experience any cold or flu symptoms such as cough, fever, chills, shortness of breath, etc. between now and your scheduled surgery, please notify us at the above number.   Remember:  Do not eat or drink after midnight.   You may drink clear liquids until 1015 .    Clear liquids allowed are:                    Water, Juice (No red color; non-citric and without pulp; diabetics please choose diet or no sugar options), Carbonated beverages (diabetics please choose diet or no sugar options), Clear Tea (No creamer, milk, or cream, including half & half and powdered creamer), Black Coffee Only (No creamer, milk or cream, including half & half and powdered creamer), and Clear Sports drink (No red color; diabetics please choose diet or no sugar options)    Take these medicines the morning of surgery with A SIP OF WATER xanax & prilosec   LAST JARDIANCE DOSE SHOULD BE 04/25/23  FOLLOW DIET & PREP INSTRUCTIONS PROVIDED TO YOU FROM OFFICE   Do not wear jewelry, make-up or nail polish, including gel polish,  artificial nails, or any other type of covering on natural nails (fingers and  toes).  Do not wear lotions, powders, or perfumes, or deodorant.  Do not shave 48 hours prior to surgery.  Men may shave face and neck.  Do not bring valuables to the hospital.  [facility] is not responsible for any belongings or valuables.  Contacts, dentures or bridgework may not be worn into surgery.  Leave your suitcase in the car.  After surgery it may be brought to your room.  For patients admitted to the hospital, discharge time will be determined by your treatment team.  Patients discharged the day of surgery will not be allowed to drive home.   Name and phone number of your driver:    family Special instructions:  no smoking or vaping 24 hours prior to procedure  FOLLOW DIET 7 PREP INSTRUCTIONS PROVIDED TO YOU FROM OFFICE  Please read over the following fact sheets that you were given. Anesthesia Post-op Instructions and Care and Recovery After Surgery      PATIENT INSTRUCTIONS POST-ANESTHESIA  IMMEDIATELY FOLLOWING SURGERY:  Do not drive or operate machinery for the first twenty four hours after surgery.  Do not make any important decisions for twenty four hours after surgery or while taking narcotic pain medications or sedatives.  If you develop intractable nausea and vomiting or a severe headache please notify your doctor immediately.  FOLLOW-UP:  Please make an appointment with your surgeon as instructed. You do not need to follow up with anesthesia unless specifically instructed to do so.  WOUND CARE INSTRUCTIONS (if applicable):  Keep a dry clean dressing on the anesthesia/puncture wound site if there is drainage.  Once the wound has quit draining you may leave it open to air.  Generally you should leave the bandage intact for twenty four hours unless there is drainage.  If the epidural site drains for more than 36-48 hours please call the anesthesia department.  QUESTIONS?:  Please feel free to call your physician or the hospital operator if you have any questions, and they will be happy  to assist you.      Monitored Anesthesia Care Anesthesia refers to the techniques, procedures, and medicines that help a person stay safe and comfortable during surgery. Monitored anesthesia care, or sedation, is one type of anesthesia. You may have sedation if you do not need to be asleep for your procedure. Procedures that use sedation may include: Surgery to remove cataracts from your eyes. A dental procedure. A biopsy. This is when a tissue sample is removed and looked at under a microscope. You will be watched closely during your procedure. Your level of sedation or type of  anesthesia may be changed to fit your needs. Tell a health care provider about: Any allergies you have. All medicines you are taking, including vitamins, herbs, eye drops, creams, and over-the-counter medicines. Any problems you or family members have had with anesthesia. Any bleeding problems you have. Any surgeries you have had. Any medical conditions or illnesses you have. This includes sleep apnea, cough, fever, or the flu. Whether you are pregnant or may be pregnant. Whether you use cigarettes, alcohol, or drugs. Any use of steroids, whether by mouth or as a cream. What are the risks? Your health care provider will talk with you about risks. These may include: Getting too much medicine (oversedation). Nausea. Allergic reactions to medicines. Trouble breathing. If this happens, a breathing tube may be used to help you breathe. It will be removed when you are awake and breathing on your own. Heart trouble. Lung trouble. Confusion that gets better with time (emergence delirium). What happens before the procedure? When to stop eating and drinking Follow instructions from your health care provider about what you may eat and drink. These may include: 8 hours before your procedure Stop eating most foods. Do not eat meat, fried foods, or fatty foods. Eat only light foods, such as toast or crackers. All liquids are okay except energy drinks and alcohol. 6 hours before your procedure Stop eating. Drink only clear liquids, such as water, clear fruit juice, black coffee, plain tea, and sports drinks. Do not drink energy drinks or alcohol. 2 hours before your procedure Stop drinking all liquids. You may be allowed to take medicines with small sips of water. If you do not follow your health care provider's instructions, your procedure may be delayed or canceled. Medicines Ask your health care provider about: Changing or stopping your regular medicines. These include any diabetes medicines  or blood thinners you take. Taking medicines such as aspirin and ibuprofen. These medicines can thin your blood. Do not take them unless your health care provider tells you to. Taking over-the-counter medicines, vitamins, herbs, and supplements. Testing You may have an exam or testing. You may have a blood or urine sample taken. General instructions Do not use any products that contain nicotine or tobacco for at least 4 weeks before the procedure. These products include cigarettes, chewing tobacco, and vaping devices, such as e-cigarettes. If you need help quitting, ask your health care provider. If you will be going home right after the procedure, plan to have a responsible adult: Take you home from the hospital or clinic. You will not be allowed to drive. Care for you for the time you are told. What happens during the procedure?  Your blood pressure, heart rate, breathing, level of pain, and blood oxygen level will be monitored. An IV will be inserted into one of your veins. You may be given: A sedative. This helps you relax. Anesthesia. This will: Numb certain areas of  your body. Make you fall asleep for surgery. You will be given medicines as needed to keep you comfortable. The more medicine you are given, the deeper your level of sedation will be. Your level of sedation may be changed to fit your needs. There are three levels of sedation: Mild sedation. At this level, you may feel awake and relaxed. You will be able to follow directions. Moderate sedation. At this level, you will be sleepy. You may not remember the procedure. Deep sedation. At this level, you will be asleep. You will not remember the procedure. How you get the medicines will depend on your age and the procedure. They may be given as: A pill. This may be taken by mouth (orally) or inserted into the rectum. An injection. This may be into a vein or muscle. A spray through the nose. After your procedure is over, the  medicine will be stopped. The procedure may vary among health care providers and hospitals. What happens after the procedure? Your blood pressure, heart rate, breathing rate, and blood oxygen level will be monitored until you leave the hospital or clinic. You may feel sleepy, clumsy, or nauseous. You may not remember what happened during or after the procedure. Sedation can affect you for several hours. Do not drive or use machinery until your health care provider says that it is safe. This information is not intended to replace advice given to you by your health care provider. Make sure you discuss any questions you have with your health care provider. Document Revised: 11/06/2021 Document Reviewed: 11/06/2021 Elsevier Patient Education  2024 [employer] Monitored Anesthesia Care, Care After The following information offers guidance on how to care for yourself after your procedure. Your health care provider may also give you more specific instructions. If you have problems or questions, contact your health care provider. What can I expect after the procedure? After the procedure, it is common to have: Tiredness. Little or no memory about what happened during or after the procedure. Impaired judgment when it comes to making decisions. Nausea or vomiting. Some trouble with balance. Follow these instructions at home: For the time period you were told by your health care provider:  Rest. Do not participate in activities where you could fall or become injured. Do not drive or use machinery. Do not drink alcohol. Do not take sleeping pills or medicines that cause drowsiness. Do not make important decisions or sign legal documents. Do not take care of children on your own. Medicines Take over-the-counter and prescription medicines only as told by your health care provider. If you were prescribed antibiotics, take them as told by your health care provider. Do not stop using the antibiotic  even if you start to feel better. Eating and drinking Follow instructions from your health care provider about what you may eat and drink. Drink enough fluid to keep your urine pale yellow. If you vomit: Drink clear fluids slowly and in small amounts as you are able. Clear fluids include water, ice chips, low-calorie sports drinks, and fruit juice that has water added to it (diluted fruit juice). Eat light and bland foods in small amounts as you are able. These foods include bananas, applesauce, rice, lean meats, toast, and crackers. General instructions  Have a responsible adult stay with you for the time you are told. It is important to have someone help care for you until you are awake and alert. If you have sleep apnea, surgery and some medicines can increase your risk  for breathing problems. Follow instructions from your health care provider about wearing your sleep device: When you are sleeping. This includes during daytime naps. While taking prescription pain medicines, sleeping medicines, or medicines that make you drowsy. Do not use any products that contain nicotine or tobacco. These products include cigarettes, chewing tobacco, and vaping devices, such as e-cigarettes. If you need help quitting, ask your health care provider. Contact a health care provider if: You feel nauseous or vomit every time you eat or drink. You feel light-headed. You are still sleepy or having trouble with balance after 24 hours. You get a rash. You have a fever. You have redness or swelling around the IV site. Get help right away if: You have trouble breathing. You have new confusion after you get home. These symptoms may be an emergency. Get help right away. Call 911. Do not wait to see if the symptoms will go away. Do not drive yourself to the hospital. This information is not intended to replace advice given to you by your health care provider. Make sure you discuss any questions you have with your  health care provider. Document Revised: 11/06/2021 Document Reviewed: 11/06/2021 Elsevier Patient Education  2024 [employer] Upper Endoscopy, Adult Upper endoscopy is a procedure to look inside the upper GI (gastrointestinal) tract. The upper GI tract is made up of: The esophagus. This is the part of the body that moves food from your mouth to your stomach. The stomach. The duodenum. This is the first part of your small intestine. This procedure is also called esophagogastroduodenoscopy (EGD) or gastroscopy. In this procedure, your health care provider passes a thin, flexible tube (endoscope) through your mouth and down your esophagus into your stomach and into your duodenum. A small camera is attached to the end of the tube. Images from the camera appear on a monitor in the exam room. During this procedure, your health care provider may also remove a small piece of tissue to be sent to a lab and examined under a microscope (biopsy). Your health care provider may do an upper endoscopy to diagnose cancers of the upper GI tract. You may also have this procedure to find the cause of other conditions, such as: Stomach pain. Heartburn. Pain or problems when swallowing. Nausea and vomiting. Stomach bleeding. Stomach ulcers. Tell a health care provider about: Any allergies you have. All medicines you are taking, including vitamins, herbs, eye drops, creams, and over-the-counter medicines. Any problems you or family members have had with anesthetic medicines. Any bleeding problems you have. Any surgeries you have had. Any medical conditions you have. Whether you are pregnant or may be pregnant. What are the risks? Your healthcare provider will talk with you about risks. These may include: Infection. Bleeding. Allergic reactions to medicines. A tear or hole (perforation) in the esophagus, stomach, or duodenum. What happens before the procedure? When to stop eating and drinking Follow  instructions from your health care provider about what you may eat and drink. These may include: 8 hours before your procedure Stop eating most foods. Do not eat meat, fried foods, or fatty foods. Eat only light foods, such as toast or crackers. All liquids are okay except energy drinks and alcohol. 6 hours before your procedure Stop eating. Drink only clear liquids, such as water, clear fruit juice, black coffee, plain tea, and sports drinks. Do not drink energy drinks or alcohol. 2 hours before your procedure Stop drinking all liquids. You may be allowed to take medicines with  small sips of water. If you do not follow your health care provider's instructions, your procedure may be delayed or canceled. Medicines Ask your health care provider about: Changing or stopping your regular medicines. This is especially important if you are taking diabetes medicines or blood thinners. Taking medicines such as aspirin and ibuprofen. These medicines can thin your blood. Do not take these medicines unless your health care provider tells you to take them. Taking over-the-counter medicines, vitamins, herbs, and supplements. General instructions If you will be going home right after the procedure, plan to have a responsible adult: Take you home from the hospital or clinic. You will not be allowed to drive. Care for you for the time you are told. What happens during the procedure?  An IV will be inserted into one of your veins. You may be given one or more of the following: A medicine to help you relax (sedative). A medicine to numb the throat (local anesthetic). You will lie on your left side on an exam table. Your health care provider will pass the endoscope through your mouth and down your esophagus. Your health care provider will use the scope to check the inside of your esophagus, stomach, and duodenum. Biopsies may be taken. The endoscope will be removed. The procedure may vary among health  care providers and hospitals. What happens after the procedure? Your blood pressure, heart rate, breathing rate, and blood oxygen level will be monitored until you leave the hospital or clinic. When your throat is no longer numb, you may be given some fluids to drink. If you were given a sedative during the procedure, it can affect you for several hours. Do not drive or operate machinery until your health care provider says that it is safe. It is up to you to get the results of your procedure. Ask your health care provider, or the department that is doing the procedure, when your results will be ready. Contact a health care provider if you: Have a sore throat that lasts longer than 1 day. Have a fever. Get help right away if you: Vomit blood or your vomit looks like coffee grounds. Have bloody, black, or tarry stools. Have a very bad sore throat or you cannot swallow. Have difficulty breathing or very bad pain in your chest or abdomen. These symptoms may be an emergency. Get help right away. Call 911. Do not wait to see if the symptoms will go away. Do not drive yourself to the hospital. Summary Upper endoscopy is a procedure to look inside the upper GI tract. During the procedure, an IV will be inserted into one of your veins. You may be given a medicine to help you relax. The endoscope will be passed through your mouth and down your esophagus. Follow instructions from your health care provider about what you can eat and drink. This information is not intended to replace advice given to you by your health care provider. Make sure you discuss any questions you have with your health care provider. Document Revised: 09/20/2021 Document Reviewed: 09/20/2021 Elsevier Patient Education  2024 [employer] Upper Endoscopy, Adult, Care After After the procedure, it is common to have a sore throat. It is also common to have: Mild stomach pain or discomfort. Bloating. Nausea. Follow these  instructions at home: The instructions below may help you care for yourself at home. Your health care provider may give you more instructions. If you have questions, ask your health care provider. If you were given a sedative  during the procedure, it can affect you for several hours. Do not drive or operate machinery until your health care provider says that it is safe. If you will be going home right after the procedure, plan to have a responsible adult: Take you home from the hospital or clinic. You will not be allowed to drive. Care for you for the time you are told. Follow instructions from your health care provider about what you may eat and drink. Return to your normal activities as told by your health care provider. Ask your health care provider what activities are safe for you. Take over-the-counter and prescription medicines only as told by your health care provider. Contact a health care provider if you: Have a sore throat that lasts longer than one day. Have trouble swallowing. Have a fever. Get help right away if you: Vomit blood or your vomit looks like coffee grounds. Have bloody, black, or tarry stools. Have a very bad sore throat or you cannot swallow. Have difficulty breathing or very bad pain in your chest or abdomen. These symptoms may be an emergency. Get help right away. Call 911. Do not wait to see if the symptoms will go away. Do not drive yourself to the hospital. Summary After the procedure, it is common to have a sore throat, mild stomach discomfort, bloating, and nausea. If you were given a sedative during the procedure, it can affect you for several hours. Do not drive until your health care provider says that it is safe. Follow instructions from your health care provider about what you may eat and drink. Return to your normal activities as told by your health care provider. This information is not intended to replace advice given to you by your health care  provider. Make sure you discuss any questions you have with your health care provider. Document Revised: 09/20/2021 Document Reviewed: 09/20/2021 Elsevier Patient Education  2024 [employer] Colonoscopy, Adult A colonoscopy is a procedure to look at the entire large intestine. This procedure is done using a long, thin, flexible tube that has a camera on the end. You may have a colonoscopy: As a part of normal colorectal screening. If you have certain symptoms, such as: A low number of red blood cells in your blood (anemia). Diarrhea that does not go away. Pain in your abdomen. Blood in your stool. A colonoscopy can help screen for and diagnose medical problems, including: An abnormal growth of cells or tissue (tumor). Abnormal growths within the lining of your intestine (polyps). Inflammation. Areas of bleeding. Tell your health care provider about: Any allergies you have. All medicines you are taking, including vitamins, herbs, eye drops, creams, and over-the-counter medicines. Any problems you or family members have had with anesthetic medicines. Any bleeding problems you have. Any surgeries you have had. Any medical conditions you have. Any problems you have had with having bowel movements. Whether you are pregnant or may be pregnant. What are the risks? Generally, this is a safe procedure. However, problems may occur, including: Bleeding. Damage to your intestine. Allergic reactions to medicines given during the procedure. Infection. This is rare. What happens before the procedure? Eating and drinking restrictions Follow instructions from your health care provider about eating or drinking restrictions, which may include: A few days before the procedure: Follow a low-fiber diet. Avoid nuts, seeds, dried fruit, raw fruits, and vegetables. 1-3 days before the procedure: Eat only gelatin dessert or ice pops. Drink only clear liquids, such as water, clear juice, clear broth  or bouillon, black coffee or tea, or clear soft drinks or sports drinks. Avoid liquids that contain red or purple dye. The day of the procedure: Do not eat solid foods. You may continue to drink clear liquids until up to 2 hours before the procedure. Do not eat or drink anything starting 2 hours before the procedure, or within the time period that your health care provider recommends. Bowel prep If you were prescribed a bowel prep to take by mouth (orally) to clean out your colon: Take it as told by your health care provider. Starting the day before your procedure, you will need to drink a large amount of liquid medicine. The liquid will cause you to have many bowel movements of loose stool until your stool becomes almost clear or light green. If your skin or the opening between the buttocks (anus) gets irritated from diarrhea, you may relieve the irritation using: Wipes with medicine in them, such as adult wet wipes with aloe and vitamin E. A product to soothe skin, such as petroleum jelly. If you vomit while drinking the bowel prep: Take a break for up to 60 minutes. Begin the bowel prep again. Call your health care provider if you keep vomiting or you cannot take the bowel prep without vomiting. To clean out your colon, you may also be given: Laxative medicines. These help you have a bowel movement. Instructions for enema use. An enema is liquid medicine injected into your rectum. Medicines Ask your health care provider about: Changing or stopping your regular medicines or supplements. This is especially important if you are taking iron supplements, diabetes medicines, or blood thinners. Taking medicines such as aspirin and ibuprofen. These medicines can thin your blood. Do not take these medicines unless your health care provider tells you to take them. Taking over-the-counter medicines, vitamins, herbs, and supplements. General instructions Ask your health care provider what steps will  be taken to help prevent infection. These may include washing skin with a germ-killing soap. If you will be going home right after the procedure, plan to have a responsible adult: Take you home from the hospital or clinic. You will not be allowed to drive. Care for you for the time you are told. What happens during the procedure?  An IV will be inserted into one of your veins. You will be given a medicine to make you fall asleep (general anesthetic). You will lie on your side with your knees bent. A lubricant will be put on the tube. Then the tube will be: Inserted into your anus. Gently eased through all parts of your large intestine. Air will be sent into your colon to keep it open. This may cause some pressure or cramping. Images will be taken with the camera and will appear on a screen. A small tissue sample may be removed to be looked at under a microscope (biopsy). The tissue may be sent to a lab for testing if any signs of problems are found. If small polyps are found, they may be removed and checked for cancer cells. When the procedure is finished, the tube will be removed. The procedure may vary among health care providers and hospitals. What happens after the procedure? Your blood pressure, heart rate, breathing rate, and blood oxygen level will be monitored until you leave the hospital or clinic. You may have a small amount of blood in your stool. You may pass gas and have mild cramping or bloating in your abdomen. This is caused by the air  that was used to open your colon during the exam. If you were given a sedative during the procedure, it can affect you for several hours. Do not drive or operate machinery until your health care provider says that it is safe. It is up to you to get the results of your procedure. Ask your health care provider, or the department that is doing the procedure, when your results will be ready. Summary A colonoscopy is a procedure to look at the  entire large intestine. Follow instructions from your health care provider about eating and drinking before the procedure. If you were prescribed an oral bowel prep to clean out your colon, take it as told by your health care provider. During the colonoscopy, a flexible tube with a camera on its end is inserted into the anus and then passed into all parts of the large intestine. This information is not intended to replace advice given to you by your health care provider. Make sure you discuss any questions you have with your health care provider. Document Revised: 07/24/2022 Document Reviewed: 02/01/2021 Elsevier Patient Education  2024 [employer]vier [employer]. Colonoscopy, Adult, Care After The following information offers guidance on how to care for yourself after your procedure. Your health care provider may also give you more specific instructions. If you have problems or questions, contact your health care provider. What can I expect after the procedure? After the procedure, it is common to have: A small amount of blood in your stool for 24 hours after the procedure. Some gas. Mild cramping or bloating of your abdomen. Follow these instructions at home: Eating and drinking  Drink enough fluid to keep your urine pale yellow. Follow instructions from your health care provider about eating or drinking restrictions. Resume your normal diet as told by your health care provider. Avoid heavy or fried foods that are hard to digest. Activity Rest as told by your health care provider. Avoid sitting for a long time without moving. Get up to take short walks every 1-2 hours. This is important to improve blood flow and breathing. Ask for help if you feel weak or unsteady. Return to your normal activities as told by your health care provider. Ask your health care provider what activities are safe for you. Managing cramping and bloating  Try walking around when you have cramps or feel bloated. If directed,  apply heat to your abdomen as told by your health care provider. Use the heat source that your health care provider recommends, such as a moist heat pack or a heating pad. Place a towel between your skin and the heat source. Leave the heat on for 20-30 minutes. Remove the heat if your skin turns bright red. This is especially important if you are unable to feel pain, heat, or cold. You have a greater risk of getting burned. General instructions If you were given a sedative during the procedure, it can affect you for several hours. Do not drive or operate machinery until your health care provider says that it is safe. For the first 24 hours after the procedure: Do not sign important documents. Do not drink alcohol. Do your regular daily activities at a slower pace than normal. Eat soft foods that are easy to digest. Take over-the-counter and prescription medicines only as told by your health care provider. Keep all follow-up visits. This is important. Contact a health care provider if: You have blood in your stool 2-3 days after the procedure. Get help right away if: You have  more than a small spotting of blood in your stool. You have large blood clots in your stool. You have swelling of your abdomen. You have nausea or vomiting. You have a fever. You have increasing pain in your abdomen that is not relieved with medicine. These symptoms may be an emergency. Get help right away. Call 911. Do not wait to see if the symptoms will go away. Do not drive yourself to the hospital. Summary After the procedure, it is common to have a small amount of blood in your stool. You may also have mild cramping and bloating of your abdomen. If you were given a sedative during the procedure, it can affect you for several hours. Do not drive or operate machinery until your health care provider says that it is safe. Get help right away if you have a lot of blood in your stool, nausea or vomiting, a fever, or  increased pain in your abdomen. This information is not intended to replace advice given to you by your health care provider. Make sure you discuss any questions you have with your health care provider. Document Revised: 07/24/2022 Document Reviewed: 02/01/2021 Elsevier Patient Education  2024 [employer].

## 2023-04-24 ENCOUNTER — Encounter (HOSPITAL_COMMUNITY): Payer: Self-pay

## 2023-04-24 ENCOUNTER — Encounter (HOSPITAL_COMMUNITY)
Admission: RE | Admit: 2023-04-24 | Discharge: 2023-04-24 | Disposition: A | Discharge status: Home / Self Care | Payer: 59 | Source: Ambulatory Visit | Attending: Internal Medicine | Admitting: Internal Medicine

## 2023-04-24 VITALS — BP 111/71 | Temp 97.8°F | Resp 18 | Ht 66.0 in | Wt 256.8 lb

## 2023-04-24 DIAGNOSIS — Z01810 Encounter for preprocedural cardiovascular examination: Secondary | ICD-10-CM | POA: Diagnosis not present

## 2023-04-24 DIAGNOSIS — E119 Type 2 diabetes mellitus without complications: Secondary | ICD-10-CM | POA: Insufficient documentation

## 2023-04-24 DIAGNOSIS — R194 Change in bowel habit: Secondary | ICD-10-CM

## 2023-04-29 ENCOUNTER — Encounter (HOSPITAL_COMMUNITY): Payer: Self-pay

## 2023-04-29 ENCOUNTER — Ambulatory Visit (HOSPITAL_COMMUNITY): Payer: 59 | Admitting: Anesthesiology

## 2023-04-29 ENCOUNTER — Ambulatory Visit (HOSPITAL_COMMUNITY)
Admission: RE | Admit: 2023-04-29 | Discharge: 2023-04-29 | Disposition: A | Discharge status: Home / Self Care | Payer: 59 | Attending: Internal Medicine | Admitting: Internal Medicine

## 2023-04-29 ENCOUNTER — Encounter (HOSPITAL_COMMUNITY)
Admission: RE | Disposition: A | Discharge status: Home / Self Care | Payer: Self-pay | Source: Home / Self Care | Attending: Internal Medicine

## 2023-04-29 DIAGNOSIS — Z87891 Personal history of nicotine dependence: Secondary | ICD-10-CM | POA: Insufficient documentation

## 2023-04-29 DIAGNOSIS — K2970 Gastritis, unspecified, without bleeding: Secondary | ICD-10-CM | POA: Insufficient documentation

## 2023-04-29 DIAGNOSIS — R140 Abdominal distension (gaseous): Secondary | ICD-10-CM

## 2023-04-29 DIAGNOSIS — K219 Gastro-esophageal reflux disease without esophagitis: Secondary | ICD-10-CM | POA: Insufficient documentation

## 2023-04-29 DIAGNOSIS — Z7984 Long term (current) use of oral hypoglycemic drugs: Secondary | ICD-10-CM | POA: Insufficient documentation

## 2023-04-29 DIAGNOSIS — K5900 Constipation, unspecified: Secondary | ICD-10-CM | POA: Diagnosis not present

## 2023-04-29 DIAGNOSIS — K529 Noninfective gastroenteritis and colitis, unspecified: Secondary | ICD-10-CM | POA: Diagnosis not present

## 2023-04-29 DIAGNOSIS — K625 Hemorrhage of anus and rectum: Secondary | ICD-10-CM | POA: Diagnosis not present

## 2023-04-29 DIAGNOSIS — K621 Rectal polyp: Secondary | ICD-10-CM

## 2023-04-29 DIAGNOSIS — Z6841 Body Mass Index (BMI) 40.0 and over, adult: Secondary | ICD-10-CM | POA: Diagnosis not present

## 2023-04-29 DIAGNOSIS — R1310 Dysphagia, unspecified: Secondary | ICD-10-CM | POA: Diagnosis not present

## 2023-04-29 DIAGNOSIS — D126 Benign neoplasm of colon, unspecified: Secondary | ICD-10-CM | POA: Diagnosis not present

## 2023-04-29 DIAGNOSIS — D122 Benign neoplasm of ascending colon: Secondary | ICD-10-CM | POA: Diagnosis not present

## 2023-04-29 DIAGNOSIS — K648 Other hemorrhoids: Secondary | ICD-10-CM | POA: Insufficient documentation

## 2023-04-29 DIAGNOSIS — F419 Anxiety disorder, unspecified: Secondary | ICD-10-CM | POA: Diagnosis not present

## 2023-04-29 DIAGNOSIS — E119 Type 2 diabetes mellitus without complications: Secondary | ICD-10-CM

## 2023-04-29 DIAGNOSIS — D120 Benign neoplasm of cecum: Secondary | ICD-10-CM | POA: Diagnosis not present

## 2023-04-29 DIAGNOSIS — K635 Polyp of colon: Secondary | ICD-10-CM | POA: Diagnosis not present

## 2023-04-29 HISTORY — PX: POLYPECTOMY: SHX5525

## 2023-04-29 HISTORY — PX: COLONOSCOPY WITH PROPOFOL: SHX5780

## 2023-04-29 HISTORY — PX: ESOPHAGOGASTRODUODENOSCOPY (EGD) WITH PROPOFOL: SHX5813

## 2023-04-29 HISTORY — PX: BIOPSY: SHX5522

## 2023-04-29 LAB — GLUCOSE, CAPILLARY: Glucose-Capillary: 172 mg/dL — ABNORMAL HIGH (ref 70–99)

## 2023-04-29 SURGERY — COLONOSCOPY WITH PROPOFOL
Anesthesia: General

## 2023-04-29 MED ORDER — LACTATED RINGERS IV SOLN: INTRAVENOUS | Status: DC | PRN

## 2023-04-29 MED ORDER — PROPOFOL 10 MG/ML IV BOLUS
INTRAVENOUS | Status: DC | PRN
  Administered 2023-04-29: 50 mg via INTRAVENOUS
  Administered 2023-04-29: 20 mg via INTRAVENOUS
  Administered 2023-04-29 (×3): 50 mg via INTRAVENOUS

## 2023-04-29 MED ORDER — PANTOPRAZOLE SODIUM 40 MG PO TBEC: 40.0000 mg | DELAYED_RELEASE_TABLET | Freq: Two times a day (BID) | ORAL | 11 refills | Status: DC

## 2023-04-29 MED ORDER — PROPOFOL 500 MG/50ML IV EMUL
INTRAVENOUS | Status: DC | PRN
  Administered 2023-04-29: 100 ug/kg/min via INTRAVENOUS

## 2023-04-29 MED ORDER — STERILE WATER FOR IRRIGATION IR SOLN
Status: DC | PRN
  Administered 2023-04-29: 60 mL

## 2023-04-29 NOTE — Op Note (Signed)
[facility] Patient Name: Edwin Norton Procedure Date: 04/29/2023 1:58 PM MRN: [id number] Date of Birth: 06-02-1971 Attending MD: Hennie Duos. Marletta Lor , [place], [id number] CSN: [id number] Age: 52 Admit Type: Outpatient Procedure:                Upper GI endoscopy Indications:              Dysphagia Providers:                Hennie Duos. Marletta Lor, DO, Crystal Page, Francoise Ceo                            RN, RN, Dyann Ruddle Referring MD:              Medicines:                See the Anesthesia note for documentation of the                            administered medications Complications:            No immediate complications. Estimated Blood Loss:     Estimated blood loss was minimal. Procedure:                Pre-Anesthesia Assessment:                           - The anesthesia plan was to use monitored                            anesthesia care (MAC).                           After obtaining informed consent, the endoscope was                            passed under direct vision. Throughout the                            procedure, the patient's blood pressure, pulse, and                            oxygen saturations were monitored continuously. The                            GIF-H190 ([id number]) scope was introduced through the                            mouth, and advanced to the second part of duodenum.                            The upper GI endoscopy was accomplished without                            difficulty. The patient tolerated the procedure                            well. Scope In: 2:18:20  PM Scope Out: 2:21:39 PM Total Procedure Duration: 0 hours 3 minutes 19 seconds  Findings:      The Z-line was regular and was found 44 cm from the incisors.      Patchy mild inflammation characterized by erythema was found in the       gastric body and in the gastric antrum. Biopsies were taken with a cold       forceps for Helicobacter pylori testing.      The duodenal bulb, first  portion of the duodenum and second portion of       the duodenum were normal. Impression:               - Z-line regular, 44 cm from the incisors.                           - Gastritis. Biopsied.                           - Normal duodenal bulb, first portion of the                            duodenum and second portion of the duodenum. Moderate Sedation:      Per Anesthesia Care Recommendation:           - Patient has a contact number available for                            emergencies. The signs and symptoms of potential                            delayed complications were discussed with the                            patient. Return to normal activities tomorrow.                            Written discharge instructions were provided to the                            patient.                           - Resume previous diet.                           - Continue present medications.                           - Await pathology results.                           - Return to GI clinic in 3 months. Procedure Code(s):        --- Professional ---                           [phone], Esophagogastroduodenoscopy, flexible,  transoral; with biopsy, single or multiple Diagnosis Code(s):        --- Professional ---                           K29.70, Gastritis, unspecified, without bleeding                           R13.10, Dysphagia, unspecified CPT copyright 2022 American Medical Association. All rights reserved. The codes documented in this report are preliminary and upon coder review may  be revised to meet current compliance requirements. Hennie Duos. Marletta Lor, DO Hennie Duos. Marletta Lor, DO 04/29/2023 2:24:20 PM This report has been signed electronically. Number of Addenda: 0

## 2023-04-29 NOTE — Discharge Instructions (Addendum)
EGD Discharge instructions Please read the instructions outlined below and refer to this sheet in the next few weeks. These discharge instructions provide you with general information on caring for yourself after you leave the hospital. Your doctor may also give you specific instructions. While your treatment has been planned according to the most current medical practices available, unavoidable complications occasionally occur. If you have any problems or questions after discharge, please call your doctor. ACTIVITY You may resume your regular activity but move at a slower pace for the next 24 hours.  Take frequent rest periods for the next 24 hours.  Walking will help expel (get rid of) the air and reduce the bloated feeling in your abdomen.  No driving for 24 hours (because of the anesthesia (medicine) used during the test).  You may shower.  Do not sign any important legal documents or operate any machinery for 24 hours (because of the anesthesia used during the test).  NUTRITION Drink plenty of fluids.  You may resume your normal diet.  Begin with a light meal and progress to your normal diet.  Avoid alcoholic beverages for 24 hours or as instructed by your caregiver.  MEDICATIONS You may resume your normal medications unless your caregiver tells you otherwise.  WHAT YOU CAN EXPECT TODAY You may experience abdominal discomfort such as a feeling of fullness or "gas" pains.  FOLLOW-UP Your doctor will discuss the results of your test with you.  SEEK IMMEDIATE MEDICAL ATTENTION IF ANY OF THE FOLLOWING OCCUR: Excessive nausea (feeling sick to your stomach) and/or vomiting.  Severe abdominal pain and distention (swelling).  Trouble swallowing.  Temperature over 101 F (37.8 C).  Rectal bleeding or vomiting of blood.     Colonoscopy Discharge Instructions  Read the instructions outlined below and refer to this sheet in the next few weeks. These discharge instructions provide you with  general information on caring for yourself after you leave the hospital. Your doctor may also give you specific instructions. While your treatment has been planned according to the most current medical practices available, unavoidable complications occasionally occur.   ACTIVITY You may resume your regular activity, but move at a slower pace for the next 24 hours.  Take frequent rest periods for the next 24 hours.  Walking will help get rid of the air and reduce the bloated feeling in your belly (abdomen).  No driving for 24 hours (because of the medicine (anesthesia) used during the test).   Do not sign any important legal documents or operate any machinery for 24 hours (because of the anesthesia used during the test).  NUTRITION Drink plenty of fluids.  You may resume your normal diet as instructed by your doctor.  Begin with a light meal and progress to your normal diet. Heavy or fried foods are harder to digest and may make you feel sick to your stomach (nauseated).  Avoid alcoholic beverages for 24 hours or as instructed.  MEDICATIONS You may resume your normal medications unless your doctor tells you otherwise.  WHAT YOU CAN EXPECT TODAY Some feelings of bloating in the abdomen.  Passage of more gas than usual.  Spotting of blood in your stool or on the toilet paper.  IF YOU HAD POLYPS REMOVED DURING THE COLONOSCOPY: No aspirin products for 7 days or as instructed.  No alcohol for 7 days or as instructed.  Eat a soft diet for the next 24 hours.  FINDING OUT THE RESULTS OF YOUR TEST Not all test results are  available during your visit. If your test results are not back during the visit, make an appointment with your caregiver to find out the results. Do not assume everything is normal if you have not heard from your caregiver or the medical facility. It is important for you to follow up on all of your test results.  SEEK IMMEDIATE MEDICAL ATTENTION IF: You have more than a spotting of  blood in your stool.  Your belly is swollen (abdominal distention).  You are nauseated or vomiting.  You have a temperature over 101.  You have abdominal pain or discomfort that is severe or gets worse throughout the day.   Your EGD revealed mild amount inflammation in your stomach.  I took biopsies of this to rule out infection with a bacteria called H. pylori.  Await pathology results, my office will contact you.  Your esophagus was wide open. I did not need to dilate today. Small bowel appeared normal.   Your colonoscopy revealed 5 polyp(s) which I removed successfully. Await pathology results, my office will contact you. I recommend repeating colonoscopy in 3-5 years for surveillance purposes.   I took samples of your entire colon given your abdominal pain and loose stools. We will call with these results as well.   Follow up in Gi office in 2-3 months  I hope you have a great rest of your week!  Hennie Duos. Marletta Lor, D.O. Gastroenterology and Hepatology [facility]  Post Anesthesia Home Care Instructions  Activity: Get plenty of rest for the remainder of the day. A responsible individual must stay with you for 24 hours following the procedure.  For the next 24 hours, DO NOT: -Drive a car -Advertising copywriter -Drink alcoholic beverages -Take any medication unless instructed by your physician -Make any legal decisions or sign important papers.  Meals: Start with liquid foods such as gelatin or soup. Progress to regular foods as tolerated. Avoid greasy, spicy, heavy foods. If nausea and/or vomiting occur, drink only clear liquids until the nausea and/or vomiting subsides. Call your physician if vomiting continues.  Special Instructions/Symptoms: Your throat may feel dry or sore from the anesthesia or the breathing tube placed in your throat during surgery. If this causes discomfort, gargle with warm salt water. The discomfort should disappear within 24  hours.

## 2023-04-29 NOTE — Anesthesia Preprocedure Evaluation (Signed)
Anesthesia Evaluation  Patient identified by MRN, date of birth, ID band Patient awake    Reviewed: Allergy & Precautions, H&P , NPO status , Patient's Chart, lab work & pertinent test results, reviewed documented beta blocker date and time   History of Anesthesia Complications (+) history of anesthetic complications  Airway Mallampati: II  TM Distance: >3 FB Neck ROM: full    Dental no notable dental hx.    Pulmonary neg pulmonary ROS, shortness of breath, former smoker   Pulmonary exam normal breath sounds clear to auscultation       Cardiovascular Exercise Tolerance: Good negative cardio ROS  Rhythm:regular Rate:Normal     Neuro/Psych  PSYCHIATRIC DISORDERS Anxiety Depression    negative neurological ROS  negative psych ROS   GI/Hepatic negative GI ROS, Neg liver ROS,GERD  ,,  Endo/Other  diabetes  Morbid obesity  Renal/GU negative Renal ROS  negative genitourinary   Musculoskeletal   Abdominal   Peds  Hematology negative hematology ROS (+)   Anesthesia Other Findings   Reproductive/Obstetrics negative OB ROS                             Anesthesia Physical Anesthesia Plan  ASA: 3  Anesthesia Plan: General   Post-op Pain Management:    Induction:   PONV Risk Score and Plan: Propofol infusion  Airway Management Planned:   Additional Equipment:   Intra-op Plan:   Post-operative Plan:   Informed Consent: I have reviewed the patients History and Physical, chart, labs and discussed the procedure including the risks, benefits and alternatives for the proposed anesthesia with the patient or authorized representative who has indicated his/her understanding and acceptance.     Dental Advisory Given  Plan Discussed with: CRNA  Anesthesia Plan Comments:        Anesthesia Quick Evaluation

## 2023-04-29 NOTE — Transfer of Care (Signed)
Immediate Anesthesia Transfer of Care Note  Patient: Edwin Norton  Procedure(s) Performed: COLONOSCOPY WITH PROPOFOL ESOPHAGOGASTRODUODENOSCOPY (EGD) WITH PROPOFOL BIOPSY POLYPECTOMY  Patient Location: Short Stay  Anesthesia Type:General  Level of Consciousness: awake and patient cooperative  Airway & Oxygen Therapy: Patient Spontanous Breathing  Post-op Assessment: Report given to RN and Post -op Vital signs reviewed and stable  Post vital signs: Reviewed and stable  Last Vitals:  Vitals Value Taken Time  BP 125/73 04/29/23  1500  Temp 97.6 04/29/23  1500  Pulse 78 04/29/23   1500  Resp 20 04/29/23  1500  SpO2 98 04/29/23  1500    Last Pain:  Vitals:   04/29/23 1411  TempSrc:   PainSc: 4          Complications: No notable events documented.

## 2023-04-29 NOTE — H&P (Signed)
Primary Care Physician:  Assunta Found, MD Primary Gastroenterologist:  Dr. Marletta Lor  Pre-Procedure History & Physical: HPI:  Edwin Norton is a 52 y.o. male is here for an EGD with possible dilation due to history of dysphagia and colonoscopy for rectal bleeding, chronically loose stools, abdominal bloating  Past Medical History:  Diagnosis Date   Anxiety    Complication of anesthesia 2005   does not recall name of meds. but makes his ears ring   Depression    Diabetes (HCC)    Dizziness    GERD (gastroesophageal reflux disease)    History of nephrolithiasis    HLD (hyperlipidemia)    Meniere disease    Shortness of breath     Past Surgical History:  Procedure Laterality Date   ANAL FISSURE REPAIR  Sept 2001   Dr. Theodis Sato, [facility]   ANAL FISSURE REPAIR  11/14/2011   Procedure: ANAL FISSURE REPAIR;  Surgeon: Dalia Heading, MD;  Location: [facility];  Service: General;  Laterality: N/A;  Excision Skin Tag of Anus   BRONCHOSCOPY  08/23/06   erythema was found in the left lower lobe   debridment of chronic granulation tissue and seton placement  Feb 2002   Dr. Theodis Sato, [facility], noted suprasphincteric fistula, with seton placement    ESOPHAGOGASTRODUODENOSCOPY  04/15/07   patchy erythema in antrum, negative H.pylori    EXTERNAL EAR SURGERY     right   HEMORROIDECTOMY      Prior to Admission medications   Medication Sig Start Date End Date Taking? Authorizing Provider  ALPRAZolam Prudy Feeler) 1 MG tablet Take 0.5-1 tablets by mouth 2 (two) times daily.  08/22/16  Yes [provider]  atorvastatin (LIPITOR) 40 MG tablet Take 40 mg by mouth at bedtime.   Yes [provider]  Coenzyme Q10 (COQ-10 PO) Take by mouth at bedtime.   Yes [provider]  fenofibrate 160 MG tablet Take 160 mg by mouth daily.   Yes [provider]  Multiple Vitamins-Minerals (ICAPS PO) Take by mouth daily.   Yes [provider]  omeprazole  (PRILOSEC) 40 MG capsule Take 40 mg by mouth at bedtime.   Yes [provider]  CINNAMON PO Take 1,000 mg by mouth.    [provider]  empagliflozin (JARDIANCE) 25 MG TABS tablet Take 25 mg by mouth daily.    [provider]  metFORMIN (GLUCOPHAGE) 500 MG tablet Take by mouth 2 (two) times daily.    [provider]    Allergies as of 02/18/2023 - Review Complete 02/18/2023  Allergen Reaction Noted   Morphine and codeine  11/29/2016   Percocet [oxycodone-acetaminophen] Itching 05/13/2011   Vicodin [hydrocodone-acetaminophen] Itching 05/13/2011    Family History  Problem Relation Age of Onset   Diabetes Other    Colon polyps Mother    Colon cancer Neg Hx     Social History   Socioeconomic History   Marital status: Single    Spouse name: Not on file   Number of children: Not on file   Years of education: 8   Highest education level: Not on file  Occupational History    Employer: NOT EMPLOYED  Tobacco Use   Smoking status: Former    Types: Cigarettes   Smokeless tobacco: Current    Types: Snuff  Substance and Sexual Activity   Alcohol use: No   Drug use: No   Sexual activity: Not on file  Other Topics Concern   Not  on file  Social History Narrative   Not on file   Social Determinants of Health   Financial Resource Strain: Not on file  Food Insecurity: Not on file  Transportation Needs: Not on file  Physical Activity: Not on file  Stress: Not on file  Social Connections: Not on file  Intimate Partner Violence: Not on file    Review of Systems: General: Negative for fever, chills, fatigue, weakness. Eyes: Negative for vision changes.  ENT: Negative for hoarseness, difficulty swallowing , nasal congestion. CV: Negative for chest pain, angina, palpitations, dyspnea on exertion, peripheral edema.  Respiratory: Negative for dyspnea at rest, dyspnea on exertion, cough, sputum, wheezing.  GI: See history of present illness. GU:   Negative for dysuria, hematuria, urinary incontinence, urinary frequency, nocturnal urination.  MS: Negative for joint pain, low back pain.  Derm: Negative for rash or itching.  Neuro: Negative for weakness, abnormal sensation, seizure, frequent headaches, memory loss, confusion.  Psych: Negative for anxiety, depression Endo: Negative for unusual weight change.  Heme: Negative for bruising or bleeding. Allergy: Negative for rash or hives.  Physical Exam: Vital signs in last 24 hours: Temp:  [98.1 F (36.7 C)] 98.1 F (36.7 C) (11/04 1240) Pulse Rate:  [67] 67 (11/04 1240) Resp:  [17] 17 (11/04 1240) BP: (124)/(73) 124/73 (11/04 1240) SpO2:  [99 %] 99 % (11/04 1240) Weight:  [116.5 kg] 116.5 kg (11/04 1240)   General:   Alert,  Well-developed, well-nourished, pleasant and cooperative in NAD Head:  Normocephalic and atraumatic. Eyes:  Sclera clear, no icterus.   Conjunctiva pink. Ears:  Normal auditory acuity. Nose:  No deformity, discharge,  or lesions. Msk:  Symmetrical without gross deformities. Normal posture. Extremities:  Without clubbing or edema. Neurologic:  Alert and  oriented x4;  grossly normal neurologically. Skin:  Intact without significant lesions or rashes. Psych:  Alert and cooperative. Normal mood and affect.   Impression/Plan: Edwin Norton is here for an EGD with possible dilation due to history of dysphagia and colonoscopy for rectal bleeding, chronically loose stools, abdominal bloating  Risks, benefits, limitations, imponderables and alternatives regarding procedure have been reviewed with the patient. Questions have been answered. All parties agreeable.

## 2023-04-29 NOTE — Op Note (Signed)
[facility] Patient Name: Edwin Norton Procedure Date: 04/29/2023 1:57 PM MRN: [id number] Date of Birth: September 02, 1970 Attending MD: Hennie Duos. Marletta Lor , [place], [id number] CSN: [id number] Age: 52 Admit Type: Outpatient Procedure:                Colonoscopy Indications:              Chronic diarrhea, Rectal bleeding Providers:                Hennie Duos. Marletta Lor, DO, Crystal Page, Francoise Ceo                            RN, RN, Dyann Ruddle Referring MD:              Medicines:                See the Anesthesia note for documentation of the                            administered medications Complications:            No immediate complications. Estimated Blood Loss:     Estimated blood loss was minimal. Procedure:                Pre-Anesthesia Assessment:                           - The anesthesia plan was to use monitored                            anesthesia care (MAC).                           After obtaining informed consent, the colonoscope                            was passed under direct vision. Throughout the                            procedure, the patient's blood pressure, pulse, and                            oxygen saturations were monitored continuously. The                            [id number]-HQ190L ([id number]) scope was introduced through                            the anus and advanced to the the cecum, identified                            by appendiceal orifice and ileocecal valve. The                            colonoscopy was performed without difficulty. The                            patient tolerated the procedure  well. The quality                            of the bowel preparation was evaluated using the                            BBPS [facility] Bowel Preparation Scale) with scores                            of: Right Colon = 3, Transverse Colon = 3 and Left                            Colon = 3 (entire mucosa seen well with no residual                            staining,  small fragments of stool or opaque                            liquid). The total BBPS score equals 9. Scope In: 2:25:35 PM Scope Out: 2:44:44 PM Scope Withdrawal Time: 0 hours 17 minutes 44 seconds  Total Procedure Duration: 0 hours 19 minutes 9 seconds  Findings:      Non-bleeding internal hemorrhoids were found during retroflexion.      Four sessile polyps were found in the ascending colon and cecum. The       polyps were 3 to 6 mm in size. These polyps were removed with a cold       snare. Resection and retrieval were complete.      A 4 mm polyp was found in the rectum. The polyp was sessile. The polyp       was removed with a cold snare. Resection and retrieval were complete.      Biopsies for histology were taken with a cold forceps from the ascending       colon, transverse colon and descending colon for evaluation of       microscopic colitis. Impression:               - Non-bleeding internal hemorrhoids.                           - Four 3 to 6 mm polyps in the ascending colon,                            removed with a cold snare. Resected and retrieved.                           - One 4 mm polyp in the rectum, removed with a cold                            snare. Resected and retrieved.                           - Biopsies were taken with a cold forceps from the  ascending colon, transverse colon and descending                            colon for evaluation of microscopic colitis. Moderate Sedation:      Per Anesthesia Care Recommendation:           - Patient has a contact number available for                            emergencies. The signs and symptoms of potential                            delayed complications were discussed with the                            patient. Return to normal activities tomorrow.                            Written discharge instructions were provided to the                            patient.                            - Resume previous diet.                           - Continue present medications.                           - Await pathology results.                           - Repeat colonoscopy in 3 - 5 years for                            surveillance.                           - Return to GI clinic in 3 months. Procedure Code(s):        --- Professional ---                           [phone], Colonoscopy, flexible; with removal of                            tumor(s), polyp(s), or other lesion(s) by snare                            technique                           45380, 59, Colonoscopy, flexible; with biopsy,                            single or multiple Diagnosis Code(s):        --- Professional ---  D12.2, Benign neoplasm of ascending colon                           D12.8, Benign neoplasm of rectum                           K64.8, Other hemorrhoids                           K52.9, Noninfective gastroenteritis and colitis,                            unspecified                           K62.5, Hemorrhage of anus and rectum CPT copyright 2022 American Medical Association. All rights reserved. The codes documented in this report are preliminary and upon coder review may  be revised to meet current compliance requirements. Hennie Duos. Marletta Lor, DO Hennie Duos. Marletta Lor, DO 04/29/2023 2:50:55 PM This report has been signed electronically. Number of Addenda: 0

## 2023-05-01 LAB — SURGICAL PATHOLOGY

## 2023-05-02 ENCOUNTER — Encounter (HOSPITAL_COMMUNITY): Payer: Self-pay | Admitting: Internal Medicine

## 2023-05-03 NOTE — Anesthesia Postprocedure Evaluation (Signed)
Anesthesia Post Note  Patient: Edwin Norton  Procedure(s) Performed: COLONOSCOPY WITH PROPOFOL ESOPHAGOGASTRODUODENOSCOPY (EGD) WITH PROPOFOL BIOPSY POLYPECTOMY  Patient location during evaluation: Phase II Anesthesia Type: General Level of consciousness: awake Pain management: pain level controlled Vital Signs Assessment: post-procedure vital signs reviewed and stable Respiratory status: spontaneous breathing and respiratory function stable Cardiovascular status: blood pressure returned to baseline and stable Postop Assessment: no headache and no apparent nausea or vomiting Anesthetic complications: no Comments: Late entry   No notable events documented.   Last Vitals:  Vitals:   04/29/23 1240 04/29/23 1451  BP: 124/73 125/73  Pulse: 67 78  Resp: 17 17  Temp: 36.7 C 36.4 C  SpO2: 99% 97%    Last Pain:  Vitals:   04/30/23 1407  TempSrc:   PainSc: 0-No pain                 Windell Norfolk

## 2023-05-06 DIAGNOSIS — M546 Pain in thoracic spine: Secondary | ICD-10-CM | POA: Diagnosis not present

## 2023-05-06 DIAGNOSIS — M542 Cervicalgia: Secondary | ICD-10-CM | POA: Diagnosis not present

## 2023-05-06 DIAGNOSIS — M9903 Segmental and somatic dysfunction of lumbar region: Secondary | ICD-10-CM | POA: Diagnosis not present

## 2023-05-06 DIAGNOSIS — M9901 Segmental and somatic dysfunction of cervical region: Secondary | ICD-10-CM | POA: Diagnosis not present

## 2023-05-06 DIAGNOSIS — M9902 Segmental and somatic dysfunction of thoracic region: Secondary | ICD-10-CM | POA: Diagnosis not present

## 2023-05-11 DIAGNOSIS — G4733 Obstructive sleep apnea (adult) (pediatric): Secondary | ICD-10-CM | POA: Diagnosis not present

## 2023-06-03 DIAGNOSIS — I10 Essential (primary) hypertension: Secondary | ICD-10-CM | POA: Diagnosis not present

## 2023-06-03 DIAGNOSIS — E1165 Type 2 diabetes mellitus with hyperglycemia: Secondary | ICD-10-CM | POA: Diagnosis not present

## 2023-06-03 DIAGNOSIS — Z0001 Encounter for general adult medical examination with abnormal findings: Secondary | ICD-10-CM | POA: Diagnosis not present

## 2023-06-03 DIAGNOSIS — H8103 Meniere's disease, bilateral: Secondary | ICD-10-CM | POA: Diagnosis not present

## 2023-06-03 DIAGNOSIS — E782 Mixed hyperlipidemia: Secondary | ICD-10-CM | POA: Diagnosis not present

## 2023-06-03 DIAGNOSIS — H905 Unspecified sensorineural hearing loss: Secondary | ICD-10-CM | POA: Diagnosis not present

## 2023-06-11 DIAGNOSIS — G4733 Obstructive sleep apnea (adult) (pediatric): Secondary | ICD-10-CM | POA: Diagnosis not present

## 2023-06-23 ENCOUNTER — Emergency Department (HOSPITAL_COMMUNITY)
Admission: EM | Admit: 2023-06-23 | Discharge: 2023-06-23 | Disposition: A | Discharge status: Home / Self Care | Payer: 59 | Attending: Emergency Medicine | Admitting: Emergency Medicine

## 2023-06-23 ENCOUNTER — Other Ambulatory Visit: Payer: Self-pay

## 2023-06-23 ENCOUNTER — Encounter (HOSPITAL_COMMUNITY): Payer: Self-pay | Admitting: Emergency Medicine

## 2023-06-23 DIAGNOSIS — R682 Dry mouth, unspecified: Secondary | ICD-10-CM | POA: Diagnosis not present

## 2023-06-23 DIAGNOSIS — K760 Fatty (change of) liver, not elsewhere classified: Secondary | ICD-10-CM | POA: Diagnosis not present

## 2023-06-23 DIAGNOSIS — K219 Gastro-esophageal reflux disease without esophagitis: Secondary | ICD-10-CM | POA: Insufficient documentation

## 2023-06-23 DIAGNOSIS — K1379 Other lesions of oral mucosa: Secondary | ICD-10-CM | POA: Diagnosis not present

## 2023-06-23 DIAGNOSIS — R1310 Dysphagia, unspecified: Secondary | ICD-10-CM | POA: Diagnosis not present

## 2023-06-23 DIAGNOSIS — R590 Localized enlarged lymph nodes: Secondary | ICD-10-CM | POA: Insufficient documentation

## 2023-06-23 LAB — CBC WITH DIFFERENTIAL/PLATELET
Abs Immature Granulocytes: 0.01 10*3/uL (ref 0.00–0.07)
Basophils Absolute: 0 10*3/uL (ref 0.0–0.1)
Basophils Relative: 0 %
Eosinophils Absolute: 0.1 10*3/uL (ref 0.0–0.5)
Eosinophils Relative: 2 %
HCT: 44.9 % (ref 39.0–52.0)
Hemoglobin: 14.9 g/dL (ref 13.0–17.0)
Immature Granulocytes: 0 %
Lymphocytes Relative: 52 %
Lymphs Abs: 2.1 10*3/uL (ref 0.7–4.0)
MCH: 27.5 pg (ref 26.0–34.0)
MCHC: 33.2 g/dL (ref 30.0–36.0)
MCV: 83 fL (ref 80.0–100.0)
Monocytes Absolute: 0.4 10*3/uL (ref 0.1–1.0)
Monocytes Relative: 11 %
Neutro Abs: 1.4 10*3/uL — ABNORMAL LOW (ref 1.7–7.7)
Neutrophils Relative %: 35 %
Platelets: 187 10*3/uL (ref 150–400)
RBC: 5.41 MIL/uL (ref 4.22–5.81)
RDW: 14.1 % (ref 11.5–15.5)
WBC: 4.1 10*3/uL (ref 4.0–10.5)
nRBC: 0 % (ref 0.0–0.2)

## 2023-06-23 LAB — BASIC METABOLIC PANEL
Anion gap: 8 (ref 5–15)
BUN: 9 mg/dL (ref 6–20)
CO2: 21 mmol/L — ABNORMAL LOW (ref 22–32)
Calcium: 8.7 mg/dL — ABNORMAL LOW (ref 8.9–10.3)
Chloride: 107 mmol/L (ref 98–111)
Creatinine, Ser: 0.95 mg/dL (ref 0.61–1.24)
GFR, Estimated: >60 mL/min (ref >60)
Glucose, Bld: 121 mg/dL — ABNORMAL HIGH (ref 70–99)
Potassium: 3.4 mmol/L — ABNORMAL LOW (ref 3.5–5.1)
Sodium: 136 mmol/L (ref 135–145)

## 2023-06-23 LAB — GROUP A STREP BY PCR: Group A Strep by PCR: NOT DETECTED

## 2023-06-23 MED ORDER — DEXAMETHASONE 4 MG PO TABS
10.0000 mg | ORAL_TABLET | Freq: Once | ORAL | Status: AC
  Administered 2023-06-23: 10 mg via ORAL
  Filled 2023-06-23: qty 3

## 2023-06-23 NOTE — Discharge Instructions (Signed)
Your tests are normal See your doctor this week Drink lots of fluids

## 2023-06-23 NOTE — ED Triage Notes (Signed)
Pt here for c/o difficulty swallowing as a result of dry tongue/mouth after recently having had the Flu. States that he feels like his lymph nodes are swollen.

## 2023-06-23 NOTE — ED Provider Notes (Signed)
[place] EMERGENCY DEPARTMENT AT [facility] Provider Note   CSN: [id number] Arrival date & time: 06/23/23  0536     History  Chief Complaint  Patient presents with   Dry Mouth, hard to swallow    Edwin Norton is a 52 y.o. male.  The history is provided by the patient.  He has history of GERD, fatty liver and stay that he has the flu recordings were home flu test.  He started getting sick about 5 days ago.  Since he got the flu, he has felt like his mouth is dry and is having trouble swallowing.  He still able to drink fluids and has been drinking a lot of fluids.  He denies fever or chills.  He is not short of breath.  He has noted some swollen glands in his neck.   Home Medications Prior to Admission medications   Medication Sig Start Date End Date Taking? Authorizing Provider  ALPRAZolam Prudy Feeler) 1 MG tablet Take 0.5-1 tablets by mouth 2 (two) times daily.  08/22/16   [provider]  atorvastatin (LIPITOR) 40 MG tablet Take 40 mg by mouth at bedtime.    [provider]  CINNAMON PO Take 1,000 mg by mouth.    [provider]  Coenzyme Q10 (COQ-10 PO) Take by mouth at bedtime.    [provider]  empagliflozin (JARDIANCE) 25 MG TABS tablet Take 25 mg by mouth daily.    [provider]  fenofibrate 160 MG tablet Take 160 mg by mouth daily.    [provider]  metFORMIN (GLUCOPHAGE) 500 MG tablet Take by mouth 2 (two) times daily.    [provider]  Multiple Vitamins-Minerals (ICAPS PO) Take by mouth daily.    [provider]  pantoprazole (PROTONIX) 40 MG tablet Take 1 tablet (40 mg total) by mouth 2 (two) times daily. 04/29/23 04/28/24  Lanelle Bal, DO      Allergies    Morphine and codeine, Percocet [oxycodone-acetaminophen], and Vicodin [hydrocodone-acetaminophen]    Review of Systems   Review of Systems  All other systems reviewed and are negative.   Physical Exam Updated Vital  Signs BP (!) 140/86 (BP Location: Left Arm)   Pulse 85   Temp 98.4 F (36.9 C) (Axillary)   Resp 19   Ht 5\' 6"  (1.676 m)   Wt 117.9 kg   SpO2 97%   BMI 41.97 kg/m  Physical Exam Vitals reviewed.   52 year old male, resting comfortably and in no acute distress. Vital signs are normal. Oxygen saturation is 97%, which is normal. Head is normocephalic and atraumatic. PERRLA, EOMI. Oropharynx is clear.  Mucous membranes do not appear dry. Neck is nontender and supple.  There is a palpable left submandibular lymph node and several palpable right anterior cervical lymph nodes. Lungs are clear without rales, wheezes, or rhonchi. Chest is nontender. Heart has regular rate and rhythm without murmur. Abdomen is soft, flat, nontender. Skin is warm and dry without rash. Neurologic: Mental status is normal, moves all extremities equally.  ED Results / Procedures / Treatments   Labs (all labs ordered are listed, but only abnormal results are displayed) Labs Reviewed  GROUP A STREP BY PCR  BASIC METABOLIC PANEL  CBC WITH DIFFERENTIAL/PLATELET    Procedures Procedures    Medications Ordered in ED Medications  dexamethasone (DECADRON) tablet 10 mg (10 mg Oral Given 06/23/23 0703)    ED Course/ Medical Decision Making/ A&P  Medical Decision Making Amount and/or Complexity of Data Reviewed Labs: ordered.  Risk Prescription drug management.   Sensation of dry mouth with benign exam.  I have ordered CBC and basic metabolic panel to look for evidence of dehydration, strep PCR to look for evidence of streptococcal pharyngitis.  I have reviewed his laboratory tests and my interpretation is negative PCR for strep, CBC and basic metabolic panel are pending.  Case is signed out to Dr. Hyacinth Meeker.  Final Clinical Impression(s) / ED Diagnoses Final diagnoses:  Dry mouth    Rx / DC Orders ED Discharge Orders     None         Dione Booze,  MD 06/23/23 [phone]

## 2023-06-25 DIAGNOSIS — T7840XA Allergy, unspecified, initial encounter: Secondary | ICD-10-CM | POA: Diagnosis not present

## 2023-06-27 ENCOUNTER — Encounter: Payer: Self-pay | Admitting: Gastroenterology

## 2023-07-03 ENCOUNTER — Emergency Department: Payer: 59

## 2023-07-03 ENCOUNTER — Emergency Department
Admission: EM | Admit: 2023-07-03 | Discharge: 2023-07-03 | Disposition: A | Discharge status: Home / Self Care | Payer: 59 | Attending: Emergency Medicine | Admitting: Emergency Medicine

## 2023-07-03 ENCOUNTER — Other Ambulatory Visit: Payer: Self-pay

## 2023-07-03 DIAGNOSIS — R1310 Dysphagia, unspecified: Secondary | ICD-10-CM | POA: Diagnosis not present

## 2023-07-03 DIAGNOSIS — R070 Pain in throat: Secondary | ICD-10-CM | POA: Diagnosis not present

## 2023-07-03 DIAGNOSIS — J029 Acute pharyngitis, unspecified: Secondary | ICD-10-CM | POA: Diagnosis not present

## 2023-07-03 DIAGNOSIS — Z20822 Contact with and (suspected) exposure to covid-19: Secondary | ICD-10-CM | POA: Diagnosis not present

## 2023-07-03 DIAGNOSIS — R0602 Shortness of breath: Secondary | ICD-10-CM | POA: Diagnosis not present

## 2023-07-03 DIAGNOSIS — M47812 Spondylosis without myelopathy or radiculopathy, cervical region: Secondary | ICD-10-CM | POA: Diagnosis not present

## 2023-07-03 LAB — BASIC METABOLIC PANEL
Anion gap: 11 (ref 5–15)
BUN: 15 mg/dL (ref 6–20)
CO2: 22 mmol/L (ref 22–32)
Calcium: 9 mg/dL (ref 8.9–10.3)
Chloride: 104 mmol/L (ref 98–111)
Creatinine, Ser: 1.02 mg/dL (ref 0.61–1.24)
GFR, Estimated: >60 mL/min (ref >60)
Glucose, Bld: 170 mg/dL — ABNORMAL HIGH (ref 70–99)
Potassium: 3.9 mmol/L (ref 3.5–5.1)
Sodium: 137 mmol/L (ref 135–145)

## 2023-07-03 LAB — CBC
HCT: 45.8 % (ref 39.0–52.0)
Hemoglobin: 15.6 g/dL (ref 13.0–17.0)
MCH: 28.4 pg (ref 26.0–34.0)
MCHC: 34.1 g/dL (ref 30.0–36.0)
MCV: 83.4 fL (ref 80.0–100.0)
Platelets: 321 10*3/uL (ref 150–400)
RBC: 5.49 MIL/uL (ref 4.22–5.81)
RDW: 14.2 % (ref 11.5–15.5)
WBC: 8.5 10*3/uL (ref 4.0–10.5)
nRBC: 0 % (ref 0.0–0.2)

## 2023-07-03 LAB — RESP PANEL BY RT-PCR (RSV, FLU A&B, COVID)  RVPGX2
Influenza A by PCR: NEGATIVE
Influenza B by PCR: NEGATIVE
Resp Syncytial Virus by PCR: NEGATIVE
SARS Coronavirus 2 by RT PCR: NEGATIVE

## 2023-07-03 LAB — GROUP A STREP BY PCR: Group A Strep by PCR: NOT DETECTED

## 2023-07-03 MED ORDER — AMOXICILLIN-POT CLAVULANATE 875-125 MG PO TABS: 1.0000 | ORAL_TABLET | Freq: Two times a day (BID) | ORAL | 0 refills | Status: AC

## 2023-07-03 MED ORDER — AMOXICILLIN-POT CLAVULANATE 875-125 MG PO TABS
1.0000 | ORAL_TABLET | Freq: Once | ORAL | Status: AC
  Administered 2023-07-03: 1 via ORAL
  Filled 2023-07-03: qty 1

## 2023-07-03 MED ORDER — DEXAMETHASONE SODIUM PHOSPHATE 10 MG/ML IJ SOLN
5.0000 mg | Freq: Once | INTRAMUSCULAR | Status: AC
  Administered 2023-07-03: 5 mg via INTRAVENOUS
  Filled 2023-07-03: qty 1

## 2023-07-03 MED ORDER — SODIUM CHLORIDE 0.9 % IV BOLUS
1000.0000 mL | Freq: Once | INTRAVENOUS | Status: AC
  Administered 2023-07-03: 1000 mL via INTRAVENOUS

## 2023-07-03 MED ORDER — IOHEXOL 300 MG/ML  SOLN
75.0000 mL | Freq: Once | INTRAMUSCULAR | Status: AC | PRN
  Administered 2023-07-03: 75 mL via INTRAVENOUS

## 2023-07-03 NOTE — ED Provider Triage Note (Signed)
 Emergency Medicine Provider Triage Evaluation Note  Edwin Norton , a 53 y.o. male  was evaluated in triage.  Pt complains of shortness of breath, difficulty swallowing, anxiety.  Denies sick contacts at home  Review of Systems  Positive: Negative:   Physical Exam  BP (!) 138/95 (BP Location: Left Arm)   Pulse 92   Temp 98 F (36.7 C) (Oral)   Resp 17   Ht 5' 6 (1.676 m)   Wt 117.9 kg   SpO2 98%   BMI 41.96 kg/m  Gen:   Awake, no distress   Resp:  Normal effort  MSK:   Moves extremities without difficulty  Other:    Medical Decision Making  Medically screening exam initiated at 6:02 PM.  Appropriate orders placed.  MAURI TOLEN was informed that the remainder of the evaluation will be completed by another provider, this initial triage assessment does not replace that evaluation, and the importance of remaining in the ED until their evaluation is complete.  Patient with upper respiratory symptoms ordered at respiratory panel   Janit Kast, PA-C 07/03/23 1803

## 2023-07-03 NOTE — Discharge Instructions (Signed)
 Please take medications as prescribed.  Return to the ER if you are having any increase in difficulty swallowing, fevers, chills or any urgent changes in your health.

## 2023-07-03 NOTE — ED Triage Notes (Signed)
 Pt was seen at [facility] on 12/29. Pt sts that he was hx with the flu and has been having a sore throat since than and has not been able to swallow well.

## 2023-07-03 NOTE — ED Provider Notes (Signed)
 [place] EMERGENCY DEPARTMENT AT [facility] Provider Note   CSN: [id number] Arrival date & time: 07/03/23  1731     History  Chief Complaint  Patient presents with   Sore Throat    Edwin Norton is a 53 y.o. male presents with sore throat, difficulty swallowing.  Initially had flu around Christmas time.  This resolved but has had continued sore throat and difficulty swallowing.  Able to tolerate fluids well but is having a hard time with food.  Has been on 10 mg of prednisone.  Has not been on any antibiotics.  Denies any chest pain but sometimes feels as if he has little bit of shortness of breath.  HPI     Home Medications Prior to Admission medications   Medication Sig Start Date End Date Taking? Authorizing Provider  amoxicillin -clavulanate (AUGMENTIN ) 875-125 MG tablet Take 1 tablet by mouth 2 (two) times daily for 10 days. 07/03/23 07/13/23 Yes Charlene Debby BROCKS, PA-C  ALPRAZolam (XANAX) 1 MG tablet Take 0.5-1 tablets by mouth 2 (two) times daily.  08/22/16   [provider]  atorvastatin (LIPITOR) 40 MG tablet Take 40 mg by mouth at bedtime.    [provider]  CINNAMON PO Take 1,000 mg by mouth.    [provider]  Coenzyme Q10 (COQ-10 PO) Take by mouth at bedtime.    [provider]  empagliflozin (JARDIANCE) 25 MG TABS tablet Take 25 mg by mouth daily.    [provider]  fenofibrate  160 MG tablet Take 160 mg by mouth daily.    [provider]  metFORMIN (GLUCOPHAGE) 500 MG tablet Take by mouth 2 (two) times daily.    [provider]  Multiple Vitamins-Minerals (ICAPS PO) Take by mouth daily.    [provider]  pantoprazole  (PROTONIX ) 40 MG tablet Take 1 tablet (40 mg total) by mouth 2 (two) times daily. 04/29/23 04/28/24  Cindie Carlin POUR, DO      Allergies    Morphine and codeine, Percocet [oxycodone-acetaminophen ], and Vicodin [hydrocodone -acetaminophen ]    Review of Systems   Review of  Systems  Physical Exam Updated Vital Signs BP (!) 123/90 (BP Location: Right Arm)   Pulse 84   Temp 98 F (36.7 C) (Oral)   Resp 15   Ht 5' 6 (1.676 m)   Wt 117.9 kg   SpO2 96%   BMI 41.96 kg/m  Physical Exam Constitutional:      Appearance: He is well-developed.  HENT:     Head: Normocephalic and atraumatic.     Mouth/Throat:     Pharynx: No oropharyngeal exudate or posterior oropharyngeal erythema.     Comments: No peritonsillar abscess.  Palpable submandibular gland swelling soft tissue swelling anterior neck.  No warmth or redness.  No palpable abscess. Eyes:     Conjunctiva/sclera: Conjunctivae normal.     Pupils: Pupils are equal, round, and reactive to light.  Cardiovascular:     Rate and Rhythm: Normal rate.  Pulmonary:     Effort: Pulmonary effort is normal. No respiratory distress.     Breath sounds: No wheezing or rales.  Musculoskeletal:        General: Normal range of motion.     Cervical back: Normal range of motion.  Skin:    General: Skin is warm.     Findings: No rash.  Neurological:     Mental Status: He is alert and oriented to person, place, and time.  Psychiatric:  Mood and Affect: Mood normal.        Behavior: Behavior normal.        Thought Content: Thought content normal.     ED Results / Procedures / Treatments   Labs (all labs ordered are listed, but only abnormal results are displayed) Labs Reviewed  BASIC METABOLIC PANEL - Abnormal; Notable for the following components:      Result Value   Glucose, Bld 170 (*)    All other components within normal limits  RESP PANEL BY RT-PCR (RSV, FLU A&B, COVID)  [id number]  GROUP A STREP BY PCR  CBC    EKG None  Radiology CT Soft Tissue Neck W Contrast Result Date: 07/03/2023 CLINICAL DATA:  Initial evaluation for acute throat pain, swelling. EXAM: CT NECK WITH CONTRAST TECHNIQUE: Multidetector CT imaging of the neck was performed using the standard protocol following the bolus  administration of intravenous contrast. RADIATION DOSE REDUCTION: This exam was performed according to the departmental dose-optimization program which includes automated exposure control, adjustment of the mA and/or kV according to patient size and/or use of iterative reconstruction technique. CONTRAST:  75mL OMNIPAQUE  IOHEXOL  300 MG/ML  SOLN COMPARISON:  None Available. FINDINGS: Pharynx and larynx: Oral cavity within normal limits. Palatine tonsils symmetric and within normal limits. Parapharyngeal fat maintained. Oropharynx and nasopharynx within normal limits. No retropharyngeal collection. Negative epiglottis. Hypopharynx and supraglottic larynx within normal limits. Negative glottis. Subglottic airway patent clear. Salivary glands: Parotid and submandibular glands are within normal limits. Thyroid : Normal. Lymph nodes: No enlarged or pathologic adenopathy within the neck. Vascular: Normal intravascular enhancement seen within the neck. Limited intracranial: Unremarkable. Visualized orbits: Unremarkable. Mastoids and visualized paranasal sinuses: Visualized paranasal sinuses are largely clear. Prior right mastoidectomy. Residual mastoid air cells and middle ear cavities are clear. Skeleton: No discrete or worrisome osseous lesions. Moderate spondylosis present at C5-6 and C6-7. Upper chest: No acute finding. Other: None. IMPRESSION: 1. Normal CT of the neck. No acute inflammatory changes or other abnormality. 2. Prior right mastoidectomy. 3. 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 Electronically Signed   By: Morene Hoard M.D.   On: 07/03/2023 23:15   DG Chest 2 View Result Date: 07/03/2023 CLINICAL DATA:  Shortness of breath EXAM: CHEST - 2 VIEW COMPARISON:  09/14/2022 FINDINGS: Cardiac shadow is within normal limits. Lungs are well aerated bilaterally. No focal infiltrate or effusion is seen. No bony abnormality is noted. IMPRESSION: No active cardiopulmonary disease. Electronically Signed   By: Oneil Devonshire M.D.   On: 07/03/2023 23:00    Procedures Procedures    Medications Ordered in ED Medications  sodium chloride  0.9 % bolus 1,000 mL (0 mLs Intravenous Stopped 07/03/23 2340)  iohexol  (OMNIPAQUE ) 300 MG/ML solution 75 mL (75 mLs Intravenous Contrast Given 07/03/23 2233)  amoxicillin -clavulanate (AUGMENTIN ) 875-125 MG per tablet 1 tablet (1 tablet Oral Given 07/03/23 2339)  dexamethasone  (DECADRON ) injection 5 mg (5 mg Intravenous Given 07/03/23 2339)    ED Course/ Medical Decision Making/ A&P                                 Medical Decision Making Amount and/or Complexity of Data Reviewed Labs: ordered. Radiology: ordered.  Risk Prescription drug management.    Final Clinical Impression(s) / ED Diagnoses Final diagnoses:  Sorethroat  Dysphagia, unspecified type  53 year old male with sore throat, soft tissue swelling anterior neck.  Questionable viral versus bacterial parotitis/lymphadenitis/dental infection.  Flu COVID  strep RSV negative.  CBC and BMP within normal limits.  Patient afebrile nontachycardic and appears well.  He is able to tolerate fluids well.  He is given some IV fluids here in the ED along with some dexamethasone  IV.  CT scan obtained showing normal CT of the neck, no signs of abscess formation present.  Patient will be discharged home with Augmentin  and is given strict return precautions.  Rx / DC Orders ED Discharge Orders          Ordered    amoxicillin -clavulanate (AUGMENTIN ) 875-125 MG tablet  2 times daily        07/03/23 2332              Gustin Zobrist C, PA-C 07/03/23 2349    Dorothyann Drivers, MD 07/08/23 2303

## 2023-07-17 DIAGNOSIS — K219 Gastro-esophageal reflux disease without esophagitis: Secondary | ICD-10-CM | POA: Diagnosis not present

## 2023-07-17 DIAGNOSIS — H8109 Meniere's disease, unspecified ear: Secondary | ICD-10-CM | POA: Diagnosis not present

## 2023-07-17 DIAGNOSIS — R1310 Dysphagia, unspecified: Secondary | ICD-10-CM | POA: Diagnosis not present

## 2023-07-27 DIAGNOSIS — G4733 Obstructive sleep apnea (adult) (pediatric): Secondary | ICD-10-CM | POA: Diagnosis not present

## 2023-08-01 DIAGNOSIS — H524 Presbyopia: Secondary | ICD-10-CM | POA: Diagnosis not present

## 2023-08-01 DIAGNOSIS — E119 Type 2 diabetes mellitus without complications: Secondary | ICD-10-CM | POA: Diagnosis not present

## 2023-08-19 DIAGNOSIS — H905 Unspecified sensorineural hearing loss: Secondary | ICD-10-CM | POA: Diagnosis not present

## 2023-08-30 NOTE — Progress Notes (Signed)
 Cardiology Office Note  Date:  09/02/2023   ID:  JORON VELIS, DOB 1970/11/02, MRN [id number]  PCP:  Assunta Found, MD   Chief Complaint  Patient presents with   New Patient (Initial Visit)    Ref by Dr. Phillips Odor for elevated triglycerides. Patient c/o shortness of breath with activity.     HPI:  [name] is a 53 year old gentleman with past medical history of Diabetes Obesity Who presents by referral from Dr. Assunta Found for consult  of his elevated triglycerides, shortness of breath  On discussion today he presents with his wife She reports that his diet has not been very good, lots of high carbohydrates  Lab work reviewed Total cholesterol 158, LDL 35, triglycerides 700 On fenofibrate, fish oil just started  [employer], pizza, breads in particular  Chronic stomach issues Has had CT scans and endoscopies Stomach bloated much of the time Completed EGD, colo Trouble swallowing at times On omeprazole, tried pantopazole, back to omeprazole  Chronic dizziness Drinks water, Blood pressure low, not on medications  EKG personally reviewed by myself on todays visit EKG Interpretation Date/Time:  Monday September 02 2023 10:13:56 EDT Ventricular Rate:  83 PR Interval:  148 QRS Duration:  82 QT Interval:  360 QTC Calculation: 423 R Axis:   -13  Text Interpretation: Normal sinus rhythm Normal ECG When compared with ECG of 24-Apr-2023 10:36, Left posterior fascicular block is no longer Present Confirmed by Julien Nordmann [phone]) on 09/02/2023 10:16:49 AM    PMH:   has a past medical history of Anxiety, Complication of anesthesia (2005), Depression, Diabetes (HCC), Dizziness, GERD (gastroesophageal reflux disease), History of nephrolithiasis, HLD (hyperlipidemia), Meniere disease, and Shortness of breath.  PSH:    Past Surgical History:  Procedure Laterality Date   ANAL FISSURE REPAIR  Sept 2001   Dr. Theodis Sato, [facility]   ANAL FISSURE REPAIR  11/14/2011    Procedure: ANAL FISSURE REPAIR;  Surgeon: Dalia Heading, MD;  Location: [facility];  Service: General;  Laterality: N/A;  Excision Skin Tag of Anus   BIOPSY  04/29/2023   Procedure: BIOPSY;  Surgeon: Lanelle Bal, DO;  Location: [facility];  Service: Endoscopy;;   BRONCHOSCOPY  08/23/06   erythema was found in the left lower lobe   COLONOSCOPY WITH PROPOFOL N/A 04/29/2023   Procedure: COLONOSCOPY WITH PROPOFOL;  Surgeon: Lanelle Bal, DO;  Location: [facility];  Service: Endoscopy;  Laterality: N/A;  11:15 am, asa 3   debridment of chronic granulation tissue and seton placement  Feb 2002   Dr. Theodis Sato, [facility], noted suprasphincteric fistula, with seton placement    ESOPHAGOGASTRODUODENOSCOPY  04/15/07   patchy erythema in antrum, negative H.pylori    ESOPHAGOGASTRODUODENOSCOPY (EGD) WITH PROPOFOL N/A 04/29/2023   Procedure: ESOPHAGOGASTRODUODENOSCOPY (EGD) WITH PROPOFOL;  Surgeon: Lanelle Bal, DO;  Location: [facility];  Service: Endoscopy;  Laterality: N/A;   EXTERNAL EAR SURGERY     right   HEMORROIDECTOMY     POLYPECTOMY  04/29/2023   Procedure: POLYPECTOMY;  Surgeon: Lanelle Bal, DO;  [facility]: [facility];  Service: Endoscopy;;    Current Outpatient Medications  Medication Sig Dispense Refill   ALPRAZolam (XANAX) 1 MG tablet Take 0.5-1 tablets by mouth 2 (two) times daily.      atorvastatin (LIPITOR) 40 MG tablet Take 40 mg by mouth at bedtime.     b complex vitamins capsule Take 1 capsule by mouth daily.  CINNAMON PO Take 1,000 mg by mouth.     Coenzyme Q10 (COQ-10 PO) Take by mouth at bedtime.     empagliflozin (JARDIANCE) 25 MG TABS tablet Take 25 mg by mouth daily.     fenofibrate 160 MG tablet Take 160 mg by mouth daily.     metFORMIN (GLUCOPHAGE) 500 MG tablet Take by mouth 2 (two) times daily.     Multiple Vitamins-Minerals (ICAPS PO) Take by mouth daily.     Omega-3 1000 MG CAPS Take 500 mg by mouth at bedtime.     omeprazole  (PRILOSEC) 40 MG capsule Take 40 mg by mouth daily.     traZODone (DESYREL) 50 MG tablet Take 50 mg by mouth at bedtime.     VITAMIN D, ERGOCALCIFEROL, PO Take 5,000 Units by mouth daily.     predniSONE (DELTASONE) 20 MG tablet Take 20 mg by mouth as needed. (Patient not taking: Reported on 09/02/2023)     No current facility-administered medications for this visit.    Allergies:   Morphine, Morphine and codeine, Percocet [oxycodone-acetaminophen], Vicodin [hydrocodone-acetaminophen], Hydrocodone-acetaminophen, and Oxycodone-acetaminophen   Social History:  The patient  reports that he has quit smoking. His smoking use included cigarettes. He started smoking about 35 years ago. He has a 70 pack-year smoking history. He has quit using smokeless tobacco.  His smokeless tobacco use included snuff. He reports that he does not drink alcohol and does not use drugs.   Family History:   family history includes COPD in his father; Cirrhosis in his sister; Colon polyps in his mother; Diabetes in his sister and another family member; Hyperlipidemia in his father, mother, and sister.    Review of Systems: Review of Systems  Constitutional: Negative.   HENT: Negative.    Respiratory: Negative.    Cardiovascular: Negative.   Gastrointestinal: Negative.   Musculoskeletal: Negative.   Neurological: Negative.   Psychiatric/Behavioral: Negative.    All other systems reviewed and are negative.   PHYSICAL EXAM: VS:  BP 106/80 (BP Location: Right Arm, Patient Position: Sitting, Cuff Size: Normal)   Pulse 83   Ht 5\' 6"  (1.676 m)   Wt 252 lb 8 oz (114.5 kg)   SpO2 94%   BMI 40.75 kg/m  , BMI Body mass index is 40.75 kg/m. GEN: Well nourished, well developed, in no acute distress HEENT: normal Neck: no JVD, carotid bruits, or masses Cardiac: RRR; no murmurs, rubs, or gallops,no edema  Respiratory:  clear to auscultation bilaterally, normal work of breathing GI: soft, nontender, nondistended, +  BS MS: no deformity or atrophy Skin: warm and dry, no rash Neuro:  Strength and sensation are intact Psych: euthymic mood, full affect  Recent Labs: 02/20/2023: ALT 42 07/03/2023: BUN 15; Creatinine, Ser 1.02; Hemoglobin 15.6; Platelets 321; Potassium 3.9; Sodium 137    Lipid Panel No results found for: "CHOL", "HDL", "LDLCALC", "TRIG"    Wt Readings from Last 3 Encounters:  09/02/23 252 lb 8 oz (114.5 kg)  07/03/23 260 lb (117.9 kg)  06/23/23 260 lb (117.9 kg)       ASSESSMENT AND PLAN:  Problem List Items Addressed This Visit   None Visit Diagnoses       Elevated triglycerides with high cholesterol    -  Primary   Relevant Orders   EKG 12-Lead (Completed)     Diabetes mellitus type 2 with complications (HCC)       Relevant Orders   EKG 12-Lead (Completed)      Elevated triglycerides  Reasonable cholesterol and LDL on Lipitor Reports taking fenofibrate, just started fish oil Wife who presents with him today reports they are going to change his diet Currently high carbohydrates with pizza, potatoes, breads Recommended 6 months trial of aggressive low carbohydrate foods, high-protein If numbers stay elevated could try Vascepa though they are hoping to avoid expensive medication -CT abdomen reviewed showing minimal aortic atherosclerosis, no significant coronary calcification This would place him at low risk of cardiac events  Diabetes type 2 Stressed the importance of low carbohydrate diet, exercise program, weight loss  Dizziness Etiology unclear, blood pressure low today, recommend he stay hydrated Less likely arrhythmia as it happens in the setting of getting up from a crouched position.  Likely orthostasis  Shortness of breath Likely secondary to deconditioning, obesity Loss and exercise program recommended  Patient seen in consultation for Dr. Phillips Odor and will be referred back to his office for ongoing care of the issues detailed above  Signed, Dossie Arbour, M.D., Ph.D. [facility] [place], [place] [phone]

## 2023-09-02 ENCOUNTER — Encounter: Payer: Self-pay | Admitting: Cardiovascular Disease

## 2023-09-02 ENCOUNTER — Ambulatory Visit: Payer: 59 | Attending: Cardiovascular Disease | Admitting: Cardiovascular Disease

## 2023-09-02 VITALS — BP 106/80 | HR 83 | Ht 66.0 in | Wt 252.5 lb

## 2023-09-02 DIAGNOSIS — E782 Mixed hyperlipidemia: Secondary | ICD-10-CM | POA: Diagnosis not present

## 2023-09-02 DIAGNOSIS — E118 Type 2 diabetes mellitus with unspecified complications: Secondary | ICD-10-CM

## 2023-09-02 NOTE — Patient Instructions (Signed)

## 2023-09-09 DIAGNOSIS — M546 Pain in thoracic spine: Secondary | ICD-10-CM | POA: Diagnosis not present

## 2023-09-09 DIAGNOSIS — M9902 Segmental and somatic dysfunction of thoracic region: Secondary | ICD-10-CM | POA: Diagnosis not present

## 2023-09-09 DIAGNOSIS — G4733 Obstructive sleep apnea (adult) (pediatric): Secondary | ICD-10-CM | POA: Diagnosis not present

## 2023-09-09 DIAGNOSIS — M542 Cervicalgia: Secondary | ICD-10-CM | POA: Diagnosis not present

## 2023-09-09 DIAGNOSIS — M9901 Segmental and somatic dysfunction of cervical region: Secondary | ICD-10-CM | POA: Diagnosis not present

## 2023-09-09 DIAGNOSIS — M9903 Segmental and somatic dysfunction of lumbar region: Secondary | ICD-10-CM | POA: Diagnosis not present

## 2023-10-24 DIAGNOSIS — G4733 Obstructive sleep apnea (adult) (pediatric): Secondary | ICD-10-CM | POA: Diagnosis not present

## 2023-11-20 DIAGNOSIS — M9903 Segmental and somatic dysfunction of lumbar region: Secondary | ICD-10-CM | POA: Diagnosis not present

## 2023-11-20 DIAGNOSIS — M9902 Segmental and somatic dysfunction of thoracic region: Secondary | ICD-10-CM | POA: Diagnosis not present

## 2023-11-20 DIAGNOSIS — M9901 Segmental and somatic dysfunction of cervical region: Secondary | ICD-10-CM | POA: Diagnosis not present

## 2023-11-20 DIAGNOSIS — M542 Cervicalgia: Secondary | ICD-10-CM | POA: Diagnosis not present

## 2023-11-20 DIAGNOSIS — M546 Pain in thoracic spine: Secondary | ICD-10-CM | POA: Diagnosis not present

## 2023-12-02 DIAGNOSIS — M9902 Segmental and somatic dysfunction of thoracic region: Secondary | ICD-10-CM | POA: Diagnosis not present

## 2023-12-02 DIAGNOSIS — M542 Cervicalgia: Secondary | ICD-10-CM | POA: Diagnosis not present

## 2023-12-02 DIAGNOSIS — M546 Pain in thoracic spine: Secondary | ICD-10-CM | POA: Diagnosis not present

## 2023-12-02 DIAGNOSIS — M9903 Segmental and somatic dysfunction of lumbar region: Secondary | ICD-10-CM | POA: Diagnosis not present

## 2023-12-02 DIAGNOSIS — M9901 Segmental and somatic dysfunction of cervical region: Secondary | ICD-10-CM | POA: Diagnosis not present

## 2023-12-09 DIAGNOSIS — G4733 Obstructive sleep apnea (adult) (pediatric): Secondary | ICD-10-CM | POA: Diagnosis not present

## 2023-12-10 DIAGNOSIS — G4733 Obstructive sleep apnea (adult) (pediatric): Secondary | ICD-10-CM | POA: Diagnosis not present

## 2023-12-18 ENCOUNTER — Ambulatory Visit: Admitting: "Endocrinology

## 2024-01-01 DIAGNOSIS — M546 Pain in thoracic spine: Secondary | ICD-10-CM | POA: Diagnosis not present

## 2024-01-01 DIAGNOSIS — M9903 Segmental and somatic dysfunction of lumbar region: Secondary | ICD-10-CM | POA: Diagnosis not present

## 2024-01-01 DIAGNOSIS — M9902 Segmental and somatic dysfunction of thoracic region: Secondary | ICD-10-CM | POA: Diagnosis not present

## 2024-01-01 DIAGNOSIS — M9901 Segmental and somatic dysfunction of cervical region: Secondary | ICD-10-CM | POA: Diagnosis not present

## 2024-01-01 DIAGNOSIS — M542 Cervicalgia: Secondary | ICD-10-CM | POA: Diagnosis not present

## 2024-01-13 DIAGNOSIS — H8109 Meniere's disease, unspecified ear: Secondary | ICD-10-CM | POA: Diagnosis not present

## 2024-01-24 DIAGNOSIS — G4733 Obstructive sleep apnea (adult) (pediatric): Secondary | ICD-10-CM | POA: Diagnosis not present

## 2024-04-07 DIAGNOSIS — G4733 Obstructive sleep apnea (adult) (pediatric): Secondary | ICD-10-CM | POA: Diagnosis not present

## 2024-04-27 ENCOUNTER — Other Ambulatory Visit

## 2024-04-27 ENCOUNTER — Encounter: Payer: Self-pay | Admitting: "Endocrinology

## 2024-04-27 ENCOUNTER — Ambulatory Visit (INDEPENDENT_AMBULATORY_CARE_PROVIDER_SITE_OTHER): Admitting: "Endocrinology

## 2024-04-27 VITALS — BP 110/70 | HR 84 | Ht 66.0 in | Wt 246.0 lb

## 2024-04-27 DIAGNOSIS — E1165 Type 2 diabetes mellitus with hyperglycemia: Secondary | ICD-10-CM

## 2024-04-27 DIAGNOSIS — E7800 Pure hypercholesterolemia, unspecified: Secondary | ICD-10-CM

## 2024-04-27 DIAGNOSIS — Z7984 Long term (current) use of oral hypoglycemic drugs: Secondary | ICD-10-CM

## 2024-04-27 LAB — POCT GLYCOSYLATED HEMOGLOBIN (HGB A1C): Hemoglobin A1C: 7.8 % — AB (ref 4.0–5.6)

## 2024-04-27 MED ORDER — TIRZEPATIDE 2.5 MG/0.5ML ~~LOC~~ SOAJ: 2.5000 mg | SUBCUTANEOUS | 0 refills | Status: DC

## 2024-04-27 NOTE — Patient Instructions (Signed)

## 2024-04-27 NOTE — Progress Notes (Signed)
 Outpatient Endocrinology Note Edwin Birmingham, MD  04/27/24   Edwin Norton 30-Dec-1970 [id number]  Referring Provider: Marvine Rush, MD Primary Care Provider: Marvine Rush, MD Reason for consultation: Subjective   Assessment & Plan  Diagnoses and all orders for this visit:  Uncontrolled type 2 diabetes mellitus with hyperglycemia (HCC) -     POCT glycosylated hemoglobin (Hb A1C) -     Lipid panel -     Comprehensive metabolic panel with GFR -     Microalbumin / creatinine urine ratio -     Ambulatory referral to diabetic education  Long term (current) use of oral hypoglycemic drugs  Other orders -     tirzepatide (MOUNJARO) 2.5 MG/0.5ML Pen; Inject 2.5 mg into the skin once a week.   Diabetes Type II complicated by neuropathy, No results found for: GFR Hba1c goal less than 7, current Hba1c is  Lab Results  Component Value Date   HGBA1C 7.8 (A) 04/27/2024   Will recommend the following: Jardiance 25 mg every day Metformin XR 500mg  bid Mounjaro 2.5mg /week  No known contraindications/side effects to any of above medications No history of MEN syndrome/medullary thyroid  cancer/pancreatitis or pancreatic cancer in self or family  -Last LD and Tg are as follows: No results found for: LDLCALC No results found for: TRIG -On atorvastatin 20 mg QD -Follow low fat diet and exercise   -Blood pressure goal <140/90 - Microalbumin/creatinine goal is < 30 -Last MA/Cr is as follows: No results found for: MICROALBUR, [id number] -not on ACE/ARB -diet changes including salt restriction -limit eating outside -counseled BP targets per standards of diabetes care -uncontrolled blood pressure can lead to retinopathy, nephropathy and cardiovascular and atherosclerotic heart disease  Reviewed and counseled on: -A1C target -Blood sugar targets -Complications of uncontrolled diabetes  -Checking blood sugar before meals and bedtime and bring log next visit -All  medications with mechanism of action and side effects -Hypoglycemia management: rule of 15's, Glucagon Emergency Kit and medical alert ID -low-carb low-fat plate-method diet -At least 20 minutes of physical activity per day -Annual dilated retinal eye exam and foot exam -compliance and follow up needs -follow up as scheduled or earlier if problem gets worse  Call if blood sugar is less than 70 or consistently above 250    Take a 15 gm snack of carbohydrate at bedtime before you go to sleep if your blood sugar is less than 100.    If you are going to fast after midnight for a test or procedure, ask your physician for instructions on how to reduce/decrease your insulin dose.    Call if blood sugar is less than 70 or consistently above 250  -Treating a low sugar by rule of 15  (15 gms of sugar every 15 min until sugar is more than 70) If you feel your sugar is low, test your sugar to be sure If your sugar is low (less than 70), then take 15 grams of a fast acting Carbohydrate (3-4 glucose tablets or glucose gel or 4 ounces of juice or regular soda) Recheck your sugar 15 min after treating low to make sure it is more than 70 If sugar is still less than 70, treat again with 15 grams of carbohydrate          Don't drive the hour of hypoglycemia  If unconscious/unable to eat or drink by mouth, use glucagon injection or nasal spray baqsimi and call 911. Can repeat again in 15 min if still unconscious.  Return in about 4 weeks (around 05/25/2024).   I have reviewed current medications, nurse's notes, allergies, vital signs, past medical and surgical history, family medical history, and social history for this encounter. Counseled patient on symptoms, examination findings, lab findings, imaging results, treatment decisions and monitoring and prognosis. The patient understood the recommendations and agrees with the treatment plan. All questions regarding treatment plan were fully answered.  Edwin Birmingham, MD  04/27/24    History of Present Illness Edwin Norton is a 53 y.o. year old male who presents for evaluation of Type II diabetes mellitus.  Edwin Norton was first diagnosed around 2019.   Diabetes education +  Jun 05 2023:  A1C 8.1  Hb 17 GFR 81 ALT 42 AST 28 HDL 2 5 VLDL 9 8 LDL 35 Total cholesterol 158 TG 714  Home diabetes regimen: Jardiance 25 gm every day Metformin XR 500mg  bid  COMPLICATIONS -  MI/Stroke -  retinopathy +  neuropathy -  nephropathy  SYMPTOMS REVIEWED - Polyuria - Weight loss + Blurred vision  BLOOD SUGAR DATA Did not bring meter  Physical Exam  BP 110/70   Pulse 84   Ht 5' 6 (1.676 m)   Wt 246 lb (111.6 kg)   SpO2 96%   BMI 39.71 kg/m    Constitutional: well developed, well nourished Head: normocephalic, atraumatic Eyes: sclera anicteric, no redness Neck: supple Lungs: normal respiratory effort Neurology: alert and oriented Skin: dry, no appreciable rashes Musculoskeletal: no appreciable defects Psychiatric: normal mood and affect Diabetic Foot Exam - Simple   No data filed      Current Medications Patient's Medications  New Prescriptions   TIRZEPATIDE (MOUNJARO) 2.5 MG/0.5ML PEN    Inject 2.5 mg into the skin once a week.  Previous Medications   ALPRAZOLAM (XANAX) 1 MG TABLET    Take 0.5-1 tablets by mouth 2 (two) times daily.    ATORVASTATIN (LIPITOR) 40 MG TABLET    Take 40 mg by mouth at bedtime.   B COMPLEX VITAMINS CAPSULE    Take 1 capsule by mouth daily.   CINNAMON PO    Take 1,000 mg by mouth.   COENZYME Q10 (COQ-10 PO)    Take by mouth at bedtime.   EMPAGLIFLOZIN (JARDIANCE) 25 MG TABS TABLET    Take 25 mg by mouth daily.   FENOFIBRATE 160 MG TABLET    Take 160 mg by mouth daily.   METFORMIN (GLUCOPHAGE) 500 MG TABLET    Take by mouth 2 (two) times daily.   MULTIPLE VITAMINS-MINERALS (ICAPS PO)    Take by mouth daily.   OMEGA-3 1000 MG CAPS    Take 500 mg by mouth at bedtime.   OMEPRAZOLE   (PRILOSEC) 40 MG CAPSULE    Take 40 mg by mouth daily.   PREDNISONE (DELTASONE) 20 MG TABLET    Take 20 mg by mouth as needed.   TRAZODONE (DESYREL) 50 MG TABLET    Take 50 mg by mouth at bedtime.   VITAMIN D, ERGOCALCIFEROL, PO    Take 5,000 Units by mouth daily.  Modified Medications   No medications on file  Discontinued Medications   No medications on file    Allergies Allergies  Allergen Reactions   Morphine    Morphine And Codeine    Percocet [place] ] Itching   Vicodin [Hydrocodone -Acetaminophen ] Itching   Hydrocodone -Acetaminophen  Itching   Oxycodone-Acetaminophen  Itching    Past Medical History Past Medical History:  Diagnosis Date   Anxiety  Complication of anesthesia 2005   does not recall name of meds. but makes his ears ring   Depression    Diabetes (HCC)    Dizziness    GERD (gastroesophageal reflux disease)    History of nephrolithiasis    HLD (hyperlipidemia)    Meniere disease    Shortness of breath     Past Surgical History Past Surgical History:  Procedure Laterality Date   ANAL FISSURE REPAIR  Sept 2001   Dr. Susette Kos, [facility]   ANAL FISSURE REPAIR  11/14/2011   Procedure: ANAL FISSURE REPAIR;  Surgeon: Oneil DELENA Budge, MD;  Location: [facility];  Service: General;  Laterality: N/A;  Excision Skin Tag of Anus   BIOPSY  04/29/2023   Procedure: BIOPSY;  Surgeon: Cindie Carlin POUR, DO;  Location: [facility];  Service: Endoscopy;;   BRONCHOSCOPY  08/23/06   erythema was found in the left lower lobe   COLONOSCOPY WITH PROPOFOL  N/A 04/29/2023   Procedure: COLONOSCOPY WITH PROPOFOL ;  Surgeon: Cindie Carlin POUR, DO;  Location: [facility];  Service: Endoscopy;  Laterality: N/A;  11:15 am, asa 3   debridment of chronic granulation tissue and seton placement  Feb 2002   Dr. Susette Kos, [facility], noted suprasphincteric fistula, with seton placement    ESOPHAGOGASTRODUODENOSCOPY  04/15/07   patchy erythema in antrum,  negative H.pylori    ESOPHAGOGASTRODUODENOSCOPY (EGD) WITH PROPOFOL  N/A 04/29/2023   Procedure: ESOPHAGOGASTRODUODENOSCOPY (EGD) WITH PROPOFOL ;  Surgeon: Cindie Carlin POUR, DO;  Location: [facility];  Service: Endoscopy;  Laterality: N/A;   EXTERNAL EAR SURGERY     right   HEMORROIDECTOMY     POLYPECTOMY  04/29/2023   Procedure: POLYPECTOMY;  Surgeon: Cindie Carlin POUR, DO;  Location: [facility];  Service: Endoscopy;;    Family History family history includes COPD in his father; Cirrhosis in his sister; Colon polyps in his mother; Diabetes in his sister and another family member; Hyperlipidemia in his father, mother, and sister.  Social History Social History   Socioeconomic History   Marital status: Single    Spouse name: Not on file   Number of children: Not on file   Years of education: 8   Highest education level: Not on file  Occupational History    Employer: NOT EMPLOYED  Tobacco Use   Smoking status: Former    Current packs/day: 2.00    Average packs/day: 2.0 packs/day for 35.7 years (71.3 ttl pk-yrs)    Types: Cigarettes    Start date: 09/01/1988   Smokeless tobacco: Former    Types: Snuff  Vaping Use   Vaping status: Never Used  Substance and Sexual Activity   Alcohol use: No   Drug use: No   Sexual activity: Not on file  Other Topics Concern   Not on file  Social History Narrative   Not on file   Social Drivers of Health   Financial Resource Strain: Not on file  Food Insecurity: Not on file  Transportation Needs: Not on file  Physical Activity: Not on file  Stress: Not on file  Social Connections: Not on file  Intimate Partner Violence: Not on file    Lab Results  Component Value Date   HGBA1C 7.8 (A) 04/27/2024   No results found for: CHOL No results found for: HDL No results found for: LDLCALC No results found for: TRIG No results found for: [facility] Lab Results  Component Value Date   CREATININE 1.02 07/03/2023   No results  found for: GFR No results found for: MACKEY CURRENT    Component Value Date/Time   NA 137 07/03/2023 2155   NA 140 08/10/2012 1329   K 3.9 07/03/2023 2155   K 3.6 08/10/2012 1329   CL 104 07/03/2023 2155   CL 107 08/10/2012 1329   CO2 22 07/03/2023 2155   CO2 23 08/10/2012 1329   GLUCOSE 170 (H) 07/03/2023 2155   GLUCOSE 122 (H) 08/10/2012 1329   BUN 15 07/03/2023 2155   BUN 10 08/10/2012 1329   CREATININE 1.02 07/03/2023 2155   CREATININE 1.06 02/20/2023 1141   CALCIUM 9.0 07/03/2023 2155   CALCIUM 8.3 (L) 08/10/2012 1329   PROT 7.4 02/20/2023 1141   PROT 7.2 08/10/2012 1329   ALBUMIN 3.8 09/14/2022 0837   ALBUMIN 3.7 08/10/2012 1329   AST 27 02/20/2023 1141   AST 26 08/10/2012 1329   ALT 42 02/20/2023 1141   ALT 46 08/10/2012 1329   ALKPHOS 45 09/14/2022 0837   ALKPHOS 63 08/10/2012 1329   BILITOT 0.5 02/20/2023 1141   BILITOT 0.3 08/10/2012 1329   GFRNONAA >60 07/03/2023 2155   GFRNONAA 102 07/09/2019 0500   GFRAA 118 07/09/2019 0500      Latest Ref Rng & Units 07/03/2023    9:55 PM 06/23/2023    6:55 AM 02/20/2023   11:41 AM  BMP  Glucose 70 - 99 mg/dL 829  878  865   BUN 6 - 20 mg/dL 15  9  13    Creatinine 0.61 - 1.24 mg/dL 8.97  9.04  8.93   BUN/Creat Ratio 6 - 22 (calc)   SEE NOTE:   Sodium 135 - 145 mmol/L 137  136  140   Potassium 3.5 - 5.1 mmol/L 3.9  3.4  4.0   Chloride 98 - 111 mmol/L 104  107  104   CO2 22 - 32 mmol/L 22  21  25    Calcium 8.9 - 10.3 mg/dL 9.0  8.7  9.7        Component Value Date/Time   WBC 8.5 07/03/2023 2155   RBC 5.49 07/03/2023 2155   HGB 15.6 07/03/2023 2155   HGB 15.0 08/10/2012 1329   HCT 45.8 07/03/2023 2155   HCT 43.9 08/10/2012 1329   PLT 321 07/03/2023 2155   PLT 216 08/10/2012 1329   MCV 83.4 07/03/2023 2155   MCV 83 08/10/2012 1329   MCH 28.4 07/03/2023 2155   MCHC 34.1 07/03/2023 2155   RDW 14.2 07/03/2023 2155   RDW 14.7 (H) 08/10/2012 1329   LYMPHSABS 2.1 06/23/2023 0655   MONOABS 0.4  06/23/2023 0655   EOSABS 0.1 06/23/2023 0655   BASOSABS 0.0 06/23/2023 0655     Parts of this note may have been dictated using voice recognition software. There may be variances in spelling and vocabulary which are unintentional. Not all errors are proofread. Please notify the dino if any discrepancies are noted or if the meaning of any statement is not clear.

## 2024-04-28 ENCOUNTER — Ambulatory Visit: Payer: Self-pay | Admitting: "Endocrinology

## 2024-04-28 LAB — COMPREHENSIVE METABOLIC PANEL WITH GFR
AG Ratio: 1.7 (calc) (ref 1.0–2.5)
ALT: 27 U/L (ref 9–46)
AST: 18 U/L (ref 10–35)
Albumin: 4.6 g/dL (ref 3.6–5.1)
Alkaline phosphatase (APISO): 57 U/L (ref 35–144)
BUN: 12 mg/dL (ref 7–25)
CO2: 25 mmol/L (ref 20–32)
Calcium: 9.3 mg/dL (ref 8.6–10.3)
Chloride: 104 mmol/L (ref 98–110)
Creat: 0.99 mg/dL (ref 0.70–1.30)
Globulin: 2.7 g/dL (ref 1.9–3.7)
Glucose, Bld: 149 mg/dL — ABNORMAL HIGH (ref 65–99)
Potassium: 3.8 mmol/L (ref 3.5–5.3)
Sodium: 139 mmol/L (ref 135–146)
Total Bilirubin: 0.4 mg/dL (ref 0.2–1.2)
Total Protein: 7.3 g/dL (ref 6.1–8.1)
eGFR: 91 mL/min/1.73m2 (ref >=60)

## 2024-04-28 LAB — LIPID PANEL
Cholesterol: 156 mg/dL (ref <200)
HDL: 26 mg/dL — ABNORMAL LOW (ref >=40)
Non-HDL Cholesterol (Calc): 130 mg/dL — ABNORMAL HIGH (ref <130)
Total CHOL/HDL Ratio: 6 (calc) — ABNORMAL HIGH (ref <5.0)
Triglycerides: 777 mg/dL — ABNORMAL HIGH (ref <150)

## 2024-04-28 LAB — MICROALBUMIN / CREATININE URINE RATIO
Creatinine, Urine: 102 mg/dL (ref 20–320)
Microalb Creat Ratio: 20 mg/g{creat} (ref <30)
Microalb, Ur: 2 mg/dL

## 2024-04-28 MED ORDER — FENOFIBRATE 48 MG PO TABS: 48.0000 mg | ORAL_TABLET | Freq: Every day | ORAL | 1 refills | Status: DC

## 2024-04-29 ENCOUNTER — Other Ambulatory Visit: Payer: Self-pay | Admitting: "Endocrinology

## 2024-05-25 ENCOUNTER — Encounter: Attending: "Endocrinology | Admitting: Nutrition

## 2024-05-25 ENCOUNTER — Encounter: Payer: Self-pay | Admitting: "Endocrinology

## 2024-05-25 ENCOUNTER — Ambulatory Visit: Admitting: "Endocrinology

## 2024-05-25 VITALS — Wt 248.0 lb

## 2024-05-25 VITALS — BP 110/80 | HR 74 | Ht 66.0 in | Wt 248.0 lb

## 2024-05-25 DIAGNOSIS — E1165 Type 2 diabetes mellitus with hyperglycemia: Secondary | ICD-10-CM

## 2024-05-25 DIAGNOSIS — E118 Type 2 diabetes mellitus with unspecified complications: Secondary | ICD-10-CM | POA: Diagnosis present

## 2024-05-25 DIAGNOSIS — Z7984 Long term (current) use of oral hypoglycemic drugs: Secondary | ICD-10-CM | POA: Diagnosis not present

## 2024-05-25 DIAGNOSIS — E782 Mixed hyperlipidemia: Secondary | ICD-10-CM

## 2024-05-25 MED ORDER — ICOSAPENT ETHYL 1 G PO CAPS: 2.0000 g | ORAL_CAPSULE | Freq: Two times a day (BID) | ORAL | 5 refills | Status: DC

## 2024-05-25 MED ORDER — PIOGLITAZONE HCL 15 MG PO TABS: 15.0000 mg | ORAL_TABLET | Freq: Every day | ORAL | 3 refills | Status: DC

## 2024-05-25 NOTE — Progress Notes (Unsigned)
 Patient is here with his wife because his triglycerides are elevated, and he has diabetes type 2.  Hgb A1 C is 7.8 SBGM: meter.  He was given a Dexcom G7 to wear because he does not like to prick his finger.  Lot #[id number],  Exp.: 1/27.  He was asked by Dr. Dartha to test ac and pc at varying times/days.  Chart was reviewed with patient   Exercise- none.  He is retired but is dizzy and is unsteady on his feet.   Diet:  very high in fat and salt.  Typical day: 4-6AM  gets up to let the dog out.  Sometimes (like last night he eats a bowl of cereal). Stays up a while and then goes back                To bed 11AM-3PM: sleeps  sometimes get up earlier and eats a can of Chicken noodle soup without crackers and diet gingerale. 3PM:  wife is at work so eats whatever=sometimes nothing until she gets home at 3 eats [employer] meal with water  8PM;  chips-1/2 bag, or nuts or Peanut butter and jelly sandwich, yogurt  11PM: more of the above Says eats out at  [employer] with fries and diet drink,  snacks on cheese or cheese/peanut butter crackers, and ice cream, chips and what ever is in the house. Discussion: What  raises triglycerides.  What foods contain saturated fats,, max. Amount pf saturated fat per day, how to read food label.    Three basic food groups, need for all 3 groups, what foods are in each group, how to limit saturated fats, better snack options and limiting cheese on sandwiches and vegetables, Lower fat choices for meats, and limiting the amounts of fried foods each week.  Stop gravies, and cream sauces on vegetables and biscuits and croissants, and switching to whole wheat breads and rolls. Questions were answered about how Ozempic works and how it controls blood sugars Goals for blood sugar control:  ac: less than 130,  2hr. Pc: less than 160.   Believe overall understanding is good, but motivation may be lacking.  I am hoping the CGM will show him how high he is going after  high fat meals to help him with his overeating. Suggested 3 meals at 10AM, 3:30PM and 8PM consisting of protein-3-4 ounces, carbs: 2 servings at breakfast and 3 at lunch and 3 at supper, and 2 at HS snack.  Low fat snack options given and suggestions for using lower fat mayo and salad dressings.

## 2024-05-25 NOTE — Progress Notes (Signed)
 Outpatient Endocrinology Note [name], MD  05/25/24   Edwin Norton [birth year] [id number]  Referring Provider: Marvine Rush, MD Primary Care Provider: Marvine Rush, MD Reason for consultation: Subjective   Assessment & Plan  Diagnoses and all orders for this visit:  Uncontrolled type 2 diabetes mellitus with hyperglycemia (HCC)  Long term (current) use of oral hypoglycemic drugs  Mixed hypercholesterolemia and hypertriglyceridemia  Other orders -     icosapent Ethyl (VASCEPA) 1 g capsule; Take 2 capsules (2 g total) by mouth 2 (two) times daily. -     pioglitazone (ACTOS) 15 MG tablet; Take 1 tablet (15 mg total) by mouth daily.   Diabetes Type II complicated by neuropathy, No results found for: GFR Hba1c goal less than 7, current Hba1c is  Lab Results  Component Value Date   HGBA1C 7.8 (A) 04/27/2024   Will recommend the following: Jardiance 25 mg every day Metformin XR 500mg  bid 05/25/24: Start pioglitazone 15 mg every day, discussed side effects, no contraindications 05/25/24: Start with Vascepa 2 g twice a day in place of krill oil Mounjaro  2.5mg /week: on hold until Tg improve   No known contraindications/side effects to any of above medications No history of MEN syndrome/medullary thyroid  cancer/pancreatitis or pancreatic cancer in self or family  -Last LD and Tg are as follows: Lab Results  Component Value Date   [facility]  04/27/2024     Comment:     . LDL cholesterol not calculated. Triglyceride levels greater than 400 mg/dL invalidate calculated LDL results. . Reference range: <100 . Desirable range <100 mg/dL for primary prevention;   <70 mg/dL for patients with CHD or diabetic patients  with > or = 2 CHD risk factors. SABRA LDL-C is now calculated using the Martin-Hopkins  calculation, which is a validated novel method providing  better accuracy than the Friedewald equation in the  estimation of LDL-C.  Gladis APPLETHWAITE et al. SANDREA.  7986;689(80): 2061-2068  (http://education.[facility].com/faq/FAQ164)     Lab Results  Component Value Date   TRIG 777 (H) 04/27/2024   -On atorvastatin 20 mg every day, fenofibrate  160mg  every day, krill oil 2000 mg qd -Follow low fat diet and exercise   -Blood pressure goal <140/90 - Microalbumin/creatinine goal is < 30 -Last MA/Cr is as follows: Lab Results  Component Value Date   MICROALBUR 2.0 04/27/2024   -not on ACE/ARB -diet changes including salt restriction -limit eating outside -counseled BP targets per standards of diabetes care -uncontrolled blood pressure can lead to retinopathy, nephropathy and cardiovascular and atherosclerotic heart disease  Reviewed and counseled on: -A1C target -Blood sugar targets -Complications of uncontrolled diabetes  -Checking blood sugar before meals and bedtime and bring log next visit -All medications with mechanism of action and side effects -Hypoglycemia management: rule of 15's, Glucagon Emergency Kit and medical alert ID -low-carb low-fat plate-method diet -At least 20 minutes of physical activity per day -Annual dilated retinal eye exam and foot exam -compliance and follow up needs -follow up as scheduled or earlier if problem gets worse  Call if blood sugar is less than 70 or consistently above 250    Take a 15 gm snack of carbohydrate at bedtime before you go to sleep if your blood sugar is less than 100.    If you are going to fast after midnight for a test or procedure, ask your physician for instructions on how to reduce/decrease your insulin dose.    Call if blood sugar is less than 70  or consistently above 250  -Treating a low sugar by rule of 15  (15 gms of sugar every 15 min until sugar is more than 70) If you feel your sugar is low, test your sugar to be sure If your sugar is low (less than 70), then take 15 grams of a fast acting Carbohydrate (3-4 glucose tablets or glucose gel or 4 ounces of juice or  regular soda) Recheck your sugar 15 min after treating low to make sure it is more than 70 If sugar is still less than 70, treat again with 15 grams of carbohydrate          Don't drive the hour of hypoglycemia  If unconscious/unable to eat or drink by mouth, use glucagon injection or nasal spray baqsimi and call 911. Can repeat again in 15 min if still unconscious.  Return in about 4 weeks (around 06/22/2024) for tele-visit.   I have reviewed current medications, nurse's notes, allergies, vital signs, past medical and surgical history, family medical history, and social history for this encounter. Counseled patient on symptoms, examination findings, lab findings, imaging results, treatment decisions and monitoring and prognosis. The patient understood the recommendations and agrees with the treatment plan. All questions regarding treatment plan were fully answered.  [name], MD  05/25/24    History of Present Illness Edwin Norton is a 53 y.o. year old male who presents for follow up of Type II diabetes mellitus.  Edwin Norton was first diagnosed around 2019.   Diabetes education +  Jun 05 2023:  A1C 8.1  Hb 17 GFR 81 ALT 42 AST 28 HDL 2 5 VLDL 9 8 LDL 35 Total cholesterol 158 TG 714  Home diabetes regimen: Jardiance 25 gm every day Metformin XR 500mg  bid  COMPLICATIONS -  MI/Stroke -  retinopathy +  neuropathy -  nephropathy  SYMPTOMS REVIEWED - Polyuria - Weight loss + Blurred vision  BLOOD SUGAR DATA Did not bring meter Checks fasting: 120-170 (300s if misses meds)  Physical Exam  BP 110/80   Pulse 74   Ht 5' 6 (1.676 m)   Wt 248 lb (112.5 kg)   SpO2 98%   BMI 40.03 kg/m    Constitutional: well developed, well nourished Head: normocephalic, atraumatic Eyes: sclera anicteric, no redness Neck: supple Lungs: normal respiratory effort Neurology: alert and oriented Skin: dry, no appreciable rashes Musculoskeletal: no appreciable  defects Psychiatric: normal mood and affect Diabetic Foot Exam - Simple   No data filed      Current Medications Patient's Medications  New Prescriptions   ICOSAPENT ETHYL (VASCEPA) 1 G CAPSULE    Take 2 capsules (2 g total) by mouth 2 (two) times daily.   PIOGLITAZONE (ACTOS) 15 MG TABLET    Take 1 tablet (15 mg total) by mouth daily.  Previous Medications   ALPRAZOLAM (XANAX) 1 MG TABLET    Take 0.5-1 tablets by mouth 2 (two) times daily.    ATORVASTATIN (LIPITOR) 40 MG TABLET    Take 40 mg by mouth at bedtime.   B COMPLEX VITAMINS CAPSULE    Take 1 capsule by mouth daily.   CINNAMON PO    Take 1,000 mg by mouth.   COENZYME Q10 (COQ-10 PO)    Take by mouth at bedtime.   EMPAGLIFLOZIN (JARDIANCE) 25 MG TABS TABLET    Take 25 mg by mouth daily.   METFORMIN (GLUCOPHAGE) 500 MG TABLET    Take by mouth 2 (two) times daily.  MULTIPLE VITAMINS-MINERALS (ICAPS PO)    Take by mouth daily.   OMEPRAZOLE  (PRILOSEC) 40 MG CAPSULE    Take 40 mg by mouth daily.   PREDNISONE (DELTASONE) 20 MG TABLET    Take 20 mg by mouth as needed.   TRAZODONE (DESYREL) 50 MG TABLET    Take 50 mg by mouth at bedtime.   VITAMIN D, ERGOCALCIFEROL, PO    Take 5,000 Units by mouth daily.  Modified Medications   No medications on file  Discontinued Medications   OMEGA-3 1000 MG CAPS    Take 500 mg by mouth at bedtime.    Allergies Allergies  Allergen Reactions   Morphine    Morphine And Codeine    Percocet [Oxycodone-Acetaminophen ] Itching   Vicodin [Hydrocodone -Acetaminophen ] Itching   Hydrocodone -Acetaminophen  Itching   Oxycodone-Acetaminophen  Itching    Past Medical History Past Medical History:  Diagnosis Date   Anxiety    Complication of anesthesia 2005   does not recall name of meds. but makes his ears ring   Depression    Diabetes (HCC)    Dizziness    GERD (gastroesophageal reflux disease)    History of nephrolithiasis    HLD (hyperlipidemia)    Meniere disease    Shortness of breath      Past Surgical History Past Surgical History:  Procedure Laterality Date   ANAL FISSURE REPAIR  Sept 2001   Dr. Susette Kos, [facility]   ANAL FISSURE REPAIR  11/14/2011   Procedure: ANAL FISSURE REPAIR;  Surgeon: Oneil DELENA Budge, MD;  Location: [facility];  Service: General;  Laterality: N/A;  Excision Skin Tag of Anus   BIOPSY  04/29/2023   Procedure: BIOPSY;  Surgeon: Cindie Carlin POUR, DO;  Location: [facility];  Service: Endoscopy;;   BRONCHOSCOPY  08/23/06   erythema was found in the left lower lobe   COLONOSCOPY WITH PROPOFOL  N/A 04/29/2023   Procedure: COLONOSCOPY WITH PROPOFOL ;  Surgeon: Cindie Carlin POUR, DO;  Location: [facility];  Service: Endoscopy;  Laterality: N/A;  11:15 am, asa 3   debridment of chronic granulation tissue and seton placement  Feb 2002   Dr. Susette Kos, [facility], noted suprasphincteric fistula, with seton placement    ESOPHAGOGASTRODUODENOSCOPY  04/15/07   patchy erythema in antrum, negative H.pylori    ESOPHAGOGASTRODUODENOSCOPY (EGD) WITH PROPOFOL  N/A 04/29/2023   Procedure: ESOPHAGOGASTRODUODENOSCOPY (EGD) WITH PROPOFOL ;  Surgeon: Cindie Carlin POUR, DO;  Location: [facility];  Service: Endoscopy;  Laterality: N/A;   EXTERNAL EAR SURGERY     right   HEMORROIDECTOMY     POLYPECTOMY  04/29/2023   Procedure: POLYPECTOMY;  Surgeon: Cindie Carlin POUR, DO;  Location: [facility];  Service: Endoscopy;;    Family History family history includes COPD in his father; Cirrhosis in his sister; Colon polyps in his mother; Diabetes in his sister and another family member; Hyperlipidemia in his father, mother, and sister.  Social History Social History   Socioeconomic History   Marital status: Single    Spouse name: Not on file   Number of children: Not on file   Years of education: 8   Highest education level: Not on file  Occupational History    Employer: NOT EMPLOYED  Tobacco Use   Smoking status: Former    Current packs/day:  2.00    Average packs/day: 2.0 packs/day for 35.7 years (71.5 ttl pk-yrs)    Types: Cigarettes    Start date: 09/01/1988   Smokeless tobacco: Former  Types: Snuff  Vaping Use   Vaping status: Never Used  Substance and Sexual Activity   Alcohol use: No   Drug use: No   Sexual activity: Not on file  Other Topics Concern   Not on file  Social History Narrative   Not on file   Social Drivers of Health   Financial Resource Strain: Not on file  Food Insecurity: Not on file  Transportation Needs: Not on file  Physical Activity: Not on file  Stress: Not on file  Social Connections: Not on file  Intimate Partner Violence: Not on file    Lab Results  Component Value Date   HGBA1C 7.8 (A) 04/27/2024   Lab Results  Component Value Date   CHOL 156 04/27/2024   Lab Results  Component Value Date   HDL 26 (L) 04/27/2024   Lab Results  Component Value Date   [facility]  04/27/2024     Comment:     . LDL cholesterol not calculated. Triglyceride levels greater than 400 mg/dL invalidate calculated LDL results. . Reference range: <100 . Desirable range <100 mg/dL for primary prevention;   <70 mg/dL for patients with CHD or diabetic patients  with > or = 2 CHD risk factors. SABRA LDL-C is now calculated using the Martin-Hopkins  calculation, which is a validated novel method providing  better accuracy than the Friedewald equation in the  estimation of LDL-C.  Gladis APPLETHWAITE et al. SANDREA. 7986;689(80): 2061-2068  (http://education.[facility].com/faq/FAQ164)    Lab Results  Component Value Date   TRIG 777 (H) 04/27/2024   Lab Results  Component Value Date   CHOLHDL 6.0 (H) 04/27/2024   Lab Results  Component Value Date   CREATININE 0.99 04/27/2024   No results found for: GFR Lab Results  Component Value Date   MICROALBUR 2.0 04/27/2024      Component Value Date/Time   NA 139 04/27/2024 1518   NA 140 08/10/2012 1329   K 3.8 04/27/2024 1518   K 3.6 08/10/2012  1329   CL 104 04/27/2024 1518   CL 107 08/10/2012 1329   CO2 25 04/27/2024 1518   CO2 23 08/10/2012 1329   GLUCOSE 149 (H) 04/27/2024 1518   GLUCOSE 122 (H) 08/10/2012 1329   BUN 12 04/27/2024 1518   BUN 10 08/10/2012 1329   CREATININE 0.99 04/27/2024 1518   CALCIUM 9.3 04/27/2024 1518   CALCIUM 8.3 (L) 08/10/2012 1329   PROT 7.3 04/27/2024 1518   PROT 7.2 08/10/2012 1329   ALBUMIN 3.8 09/14/2022 0837   ALBUMIN 3.7 08/10/2012 1329   AST 18 04/27/2024 1518   AST 26 08/10/2012 1329   ALT 27 04/27/2024 1518   ALT 46 08/10/2012 1329   ALKPHOS 45 09/14/2022 0837   ALKPHOS 63 08/10/2012 1329   BILITOT 0.4 04/27/2024 1518   BILITOT 0.3 08/10/2012 1329   GFRNONAA >60 07/03/2023 2155   GFRNONAA 102 07/09/2019 0500   GFRAA 118 07/09/2019 0500      Latest Ref Rng & Units 04/27/2024    3:18 PM 07/03/2023    9:55 PM 06/23/2023    6:55 AM  BMP  Glucose 65 - 99 mg/dL 850  829  878   BUN 7 - 25 mg/dL 12  15  9    Creatinine 0.70 - 1.30 mg/dL 9.00  8.97  9.04   BUN/Creat Ratio 6 - 22 (calc) SEE NOTE:     Sodium 135 - 146 mmol/L 139  137  136   Potassium 3.5 - 5.3 mmol/L  3.8  3.9  3.4   Chloride 98 - 110 mmol/L 104  104  107   CO2 20 - 32 mmol/L 25  22  21    Calcium 8.6 - 10.3 mg/dL 9.3  9.0  8.7        Component Value Date/Time   WBC 8.5 07/03/2023 2155   RBC 5.49 07/03/2023 2155   HGB 15.6 07/03/2023 2155   HGB 15.0 08/10/2012 1329   HCT 45.8 07/03/2023 2155   HCT 43.9 08/10/2012 1329   PLT 321 07/03/2023 2155   PLT 216 08/10/2012 1329   MCV 83.4 07/03/2023 2155   MCV 83 08/10/2012 1329   MCH 28.4 07/03/2023 2155   MCHC 34.1 07/03/2023 2155   RDW 14.2 07/03/2023 2155   RDW 14.7 (H) 08/10/2012 1329   LYMPHSABS 2.1 06/23/2023 0655   MONOABS 0.4 06/23/2023 0655   EOSABS 0.1 06/23/2023 0655   BASOSABS 0.0 06/23/2023 0655     Parts of this note may have been dictated using voice recognition software. There may be variances in spelling and vocabulary which are unintentional.  Not all errors are proofread. Please notify the dino if any discrepancies are noted or if the meaning of any statement is not clear.

## 2024-05-25 NOTE — Patient Instructions (Signed)

## 2024-05-26 ENCOUNTER — Encounter: Payer: Self-pay | Admitting: "Endocrinology

## 2024-05-26 DIAGNOSIS — E1165 Type 2 diabetes mellitus with hyperglycemia: Secondary | ICD-10-CM

## 2024-05-27 ENCOUNTER — Other Ambulatory Visit: Payer: Self-pay | Admitting: "Endocrinology

## 2024-05-27 MED ORDER — PIOGLITAZONE HCL 15 MG PO TABS: 15.0000 mg | ORAL_TABLET | Freq: Every day | ORAL | 3 refills | Status: AC

## 2024-06-02 MED ORDER — DEXCOM G7 SENSOR MISC: 3 refills | Status: AC

## 2024-06-02 NOTE — Telephone Encounter (Signed)
 He is referring to the Dexcom G7.  Can we please order this for him.  He says it is helping him to understand what foods/meals are raising his blood sugars

## 2024-06-03 ENCOUNTER — Encounter: Payer: Self-pay | Admitting: "Endocrinology

## 2024-06-11 ENCOUNTER — Ambulatory Visit: Payer: Self-pay | Admitting: Family Medicine

## 2024-06-22 ENCOUNTER — Encounter: Payer: Self-pay | Admitting: "Endocrinology

## 2024-06-22 ENCOUNTER — Telehealth (INDEPENDENT_AMBULATORY_CARE_PROVIDER_SITE_OTHER): Admitting: "Endocrinology

## 2024-06-22 VITALS — Ht 66.0 in | Wt 248.0 lb

## 2024-06-22 DIAGNOSIS — E782 Mixed hyperlipidemia: Secondary | ICD-10-CM

## 2024-06-22 DIAGNOSIS — E1165 Type 2 diabetes mellitus with hyperglycemia: Secondary | ICD-10-CM | POA: Diagnosis not present

## 2024-06-22 DIAGNOSIS — Z7984 Long term (current) use of oral hypoglycemic drugs: Secondary | ICD-10-CM | POA: Diagnosis not present

## 2024-06-22 NOTE — Addendum Note (Signed)
 Addended by: DARTHA ERNST on: 06/22/2024 12:58 PM   Modules accepted: Level of Service

## 2024-06-22 NOTE — Patient Instructions (Signed)

## 2024-06-22 NOTE — Progress Notes (Addendum)
 "  The patient reports they are currently: Edwin Norton. I spent 9 minutes on the video with the patient on the date of service. I spent an additional 1-2 minutes on pre- and post-visit activities on the date of service.   The patient was physically located in [place]  or a state in which I am permitted to provide care. The patient and/or parent/guardian understood that s/he may incur co-pays and cost sharing, and agreed to the telemedicine visit. The visit was reasonable and appropriate under the circumstances given the patient's presentation at the time.  The patient and/or parent/guardian understands the potential risks and limitations of this mode of treatment (including, but not limited to, the absence of in-person examination) and has agreed to be treated using telemedicine. The patient's/patient's family's questions regarding telemedicine have been answered.   The patient and/or parent/guardian will contact their provider's office for worsening conditions, and seek emergency medical treatment and/or call 911 if the patient deems either necessary.    Outpatient Endocrinology Note [name], MD  06/22/2024   JIOVANY SCHEFFEL 1971-01-16 [id number]  Referring Provider: Marvine Rush, MD Primary Care Provider: Marvine Rush, MD Reason for consultation: Subjective   Assessment & Plan  Diagnoses and all orders for this visit:  Uncontrolled type 2 diabetes mellitus with hyperglycemia (HCC) -     Lipid panel -     Comprehensive metabolic panel with GFR -     Hemoglobin A1c  Long term (current) use of oral hypoglycemic drugs  Mixed hypercholesterolemia and hypertriglyceridemia    Diabetes Type II complicated by neuropathy, No results found for: GFR Hba1c goal less than 7, current Hba1c is  Lab Results  Component Value Date   HGBA1C 7.8 (A) 04/27/2024   Will recommend the following: Jardiance 25 mg every day Metformin XR 500mg  bid Pioglitazone  15 mg every day  05/25/24:  Started pioglitazone  15 mg every day, discussed side effects, no contraindications 05/25/24: Started with Vascepa  2 g twice a day in place of krill oil Mounjaro  2.5mg /week: on hold until Tg improve   No known contraindications/side effects to any of above medications No history of MEN syndrome/medullary thyroid  cancer/pancreatitis or pancreatic cancer in self or family  -Last LD and Tg are as follows: Lab Results  Component Value Date   [facility]  04/27/2024     Comment:     . LDL cholesterol not calculated. Triglyceride levels greater than 400 mg/dL invalidate calculated LDL results. . Reference range: <100 . Desirable range <100 mg/dL for primary prevention;   <70 mg/dL for patients with CHD or diabetic patients  with > or = 2 CHD risk factors. SABRA LDL-C is now calculated using the Martin-Hopkins  calculation, which is a validated novel method providing  better accuracy than the Friedewald equation in the  estimation of LDL-C.  Gladis APPLETHWAITE et al. SANDREA. 7986;689(80): 2061-2068  (http://education.[facility].com/faq/FAQ164)     Lab Results  Component Value Date   TRIG 777 (H) 04/27/2024   -On atorvastatin 40 mg every day, fenofibrate  160mg  every day -05/25/24: Started Vascepa  2 g twice a day in place of krill oilkrill oil 2000 mg qd -Follow low fat diet and exercise   -Blood pressure goal <140/90 - Microalbumin/creatinine goal is < 30 -Last MA/Cr is as follows: Lab Results  Component Value Date   MICROALBUR 2.0 04/27/2024   -not on ACE/ARB -diet changes including salt restriction -limit eating outside -counseled BP targets per standards of diabetes care -uncontrolled blood pressure can lead to retinopathy, nephropathy and  cardiovascular and atherosclerotic heart disease  Reviewed and counseled on: -A1C target -Blood sugar targets -Complications of uncontrolled diabetes  -Checking blood sugar before meals and bedtime and bring log next visit -All medications with  mechanism of action and side effects -Hypoglycemia management: rule of 15's, Glucagon Emergency Kit and medical alert ID -low-carb low-fat plate-method diet -At least 20 minutes of physical activity per day -Annual dilated retinal eye exam and foot exam -compliance and follow up needs -follow up as scheduled or earlier if problem gets worse  Call if blood sugar is less than 70 or consistently above 250    Take a 15 gm snack of carbohydrate at bedtime before you go to sleep if your blood sugar is less than 100.    If you are going to fast after midnight for a test or procedure, ask your physician for instructions on how to reduce/decrease your insulin dose.    Call if blood sugar is less than 70 or consistently above 250  -Treating a low sugar by rule of 15  (15 gms of sugar every 15 min until sugar is more than 70) If you feel your sugar is low, test your sugar to be sure If your sugar is low (less than 70), then take 15 grams of a fast acting Carbohydrate (3-4 glucose tablets or glucose gel or 4 ounces of juice or regular soda) Recheck your sugar 15 min after treating low to make sure it is more than 70 If sugar is still less than 70, treat again with 15 grams of carbohydrate          Don't drive the hour of hypoglycemia  If unconscious/unable to eat or drink by mouth, use glucagon injection or nasal spray baqsimi and call 911. Can repeat again in 15 min if still unconscious.  Return in about 4 months (around 10/21/2024) for visit and 8 am labs before next visit.   I have reviewed current medications, nurse's notes, allergies, vital signs, past medical and surgical history, family medical history, and social history for this encounter. Counseled patient on symptoms, examination findings, lab findings, imaging results, treatment decisions and monitoring and prognosis. The patient understood the recommendations and agrees with the treatment plan. All questions regarding treatment plan were  fully answered.  [name], MD  06/22/2024    History of Present Illness Edwin Norton is a 53 y.o. year old male who presents for follow up of Type II diabetes mellitus.  GEARY RUFO was first diagnosed around 2019.   Diabetes education +  Jun 05 2023:  A1C 8.1  Hb 17 GFR 81 ALT 42 AST 28 HDL 2 5 VLDL 9 8 LDL 35 Total cholesterol 158 TG 714  Home diabetes regimen: Jardiance 25 gm every day Metformin XR 500mg  bid  COMPLICATIONS -  MI/Stroke -  retinopathy +  neuropathy -  nephropathy  BLOOD SUGAR DATA Checks fasting/2 hrs after meals: 130-190 (300s if misses meds) CGM interpretation: At today's visit, we reviewed her CGM downloads. The full report is scanned in the media. Reviewing the CGM trends, BG are well controlled most of the day, with some highs in daytime-early overnight.    Physical Exam  Ht 5' 6 (1.676 m)   Wt 248 lb (112.5 kg)   BMI 40.03 kg/m    Constitutional: well developed, well nourished Head: normocephalic, atraumatic Eyes: sclera anicteric, no redness Neck: supple Lungs: normal respiratory effort Neurology: alert and oriented Skin: dry, no appreciable rashes Musculoskeletal: no appreciable  defects Psychiatric: normal mood and affect Diabetic Foot Exam - Simple   No data filed      Current Medications Patient's Medications  New Prescriptions   No medications on file  Previous Medications   ALPRAZOLAM (XANAX) 1 MG TABLET    Take 0.5-1 tablets by mouth 2 (two) times daily.    ATORVASTATIN (LIPITOR) 40 MG TABLET    Take 40 mg by mouth at bedtime.   B COMPLEX VITAMINS CAPSULE    Take 1 capsule by mouth daily.   CINNAMON PO    Take 1,000 mg by mouth.   COENZYME Q10 (COQ-10 PO)    Take by mouth at bedtime.   CONTINUOUS GLUCOSE SENSOR (DEXCOM G7 SENSOR) MISC    Use to check glucose continuously, change sensor every 10 days   EMPAGLIFLOZIN (JARDIANCE) 25 MG TABS TABLET    Take 25 mg by mouth daily.   METFORMIN (GLUCOPHAGE) 500  MG TABLET    Take by mouth 2 (two) times daily.   MULTIPLE VITAMINS-MINERALS (ICAPS PO)    Take by mouth daily.   OMEPRAZOLE  (PRILOSEC) 40 MG CAPSULE    Take 40 mg by mouth daily.   PIOGLITAZONE  (ACTOS ) 15 MG TABLET    Take 1 tablet (15 mg total) by mouth daily.   PREDNISONE (DELTASONE) 20 MG TABLET    Take 20 mg by mouth as needed.   TRAZODONE (DESYREL) 50 MG TABLET    Take 50 mg by mouth at bedtime.   VITAMIN D, ERGOCALCIFEROL, PO    Take 5,000 Units by mouth daily.  Modified Medications   No medications on file  Discontinued Medications   No medications on file    Allergies Allergies  Allergen Reactions   Morphine    Morphine And Codeine    Percocet [Oxycodone-Acetaminophen ] Itching   Vicodin [Hydrocodone -Acetaminophen ] Itching   Hydrocodone -Acetaminophen  Itching   Oxycodone-Acetaminophen  Itching    Past Medical History Past Medical History:  Diagnosis Date   Anxiety    Complication of anesthesia 2005   does not recall name of meds. but makes his ears ring   Depression    Diabetes (HCC)    Dizziness    GERD (gastroesophageal reflux disease)    History of nephrolithiasis    HLD (hyperlipidemia)    Meniere disease    Shortness of breath     Past Surgical History Past Surgical History:  Procedure Laterality Date   ANAL FISSURE REPAIR  Sept 2001   Dr. Susette Kos, [facility]   ANAL FISSURE REPAIR  11/14/2011   Procedure: ANAL FISSURE REPAIR;  Surgeon: Oneil DELENA Budge, MD;  Location: [facility];  Service: General;  Laterality: N/A;  Excision Skin Tag of Anus   BIOPSY  04/29/2023   Procedure: BIOPSY;  Surgeon: Cindie Carlin POUR, DO;  Location: [facility];  Service: Endoscopy;;   BRONCHOSCOPY  08/23/06   erythema was found in the left lower lobe   COLONOSCOPY WITH PROPOFOL  N/A 04/29/2023   Procedure: COLONOSCOPY WITH PROPOFOL ;  Surgeon: Cindie Carlin POUR, DO;  Location: [facility];  Service: Endoscopy;  Laterality: N/A;  11:15 am, asa 3   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 Susette Kos, [facility], noted suprasphincteric fistula, with seton placement    ESOPHAGOGASTRODUODENOSCOPY  04/15/07   patchy erythema in antrum, negative H.pylori    ESOPHAGOGASTRODUODENOSCOPY (EGD) WITH PROPOFOL  N/A 04/29/2023   Procedure: ESOPHAGOGASTRODUODENOSCOPY (EGD) WITH PROPOFOL ;  Surgeon: Cindie Carlin POUR, DO;  Location: [facility];  Service: Endoscopy;  Laterality: N/A;   EXTERNAL EAR SURGERY     right   HEMORROIDECTOMY     POLYPECTOMY  04/29/2023   Procedure: POLYPECTOMY;  Surgeon: Cindie Carlin POUR, DO;  Location: [facility];  Service: Endoscopy;;    Family History family history includes COPD in his father; Cirrhosis in his sister; Colon polyps in his mother; Diabetes in his sister and another family member; Hyperlipidemia in his father, mother, and sister.  Social History Social History   Socioeconomic History   Marital status: Single    Spouse name: Not on file   Number of children: Not on file   Years of education: 8   Highest education level: Not on file  Occupational History    Employer: NOT EMPLOYED  Tobacco Use   Smoking status: Former    Current packs/day: 2.00    Average packs/day: 2.0 packs/day for 35.8 years (71.6 ttl pk-yrs)    Types: Cigarettes    Start date: 09/01/1988   Smokeless tobacco: Former    Types: Snuff  Vaping Use   Vaping status: Never Used  Substance and Sexual Activity   Alcohol use: No   Drug use: No   Sexual activity: Not on file  Other Topics Concern   Not on file  Social History Narrative   Not on file   Social Drivers of Health   Tobacco Use: Medium Risk (06/22/2024)   Patient History    Smoking Tobacco Use: Former    Smokeless Tobacco Use: Former    Passive Exposure: Not on Stage Manager: Not on [employer] Insecurity: Not on file  Transportation Needs: Not on file  Physical Activity: Not on file  Stress: Not on file  Social  Connections: Not on file  Intimate Partner Violence: Not on file  Depression (PHQ2-9): Not on file  Alcohol Screen: Not on file  Housing: Not on file  Utilities: Not on file  Health Literacy: Not on file    Lab Results  Component Value Date   HGBA1C 7.8 (A) 04/27/2024   Lab Results  Component Value Date   CHOL 156 04/27/2024   Lab Results  Component Value Date   HDL 26 (L) 04/27/2024   Lab Results  Component Value Date   [facility]  04/27/2024     Comment:     . LDL cholesterol not calculated. Triglyceride levels greater than 400 mg/dL invalidate calculated LDL results. . Reference range: <100 . Desirable range <100 mg/dL for primary prevention;   <70 mg/dL for patients with CHD or diabetic patients  with > or = 2 CHD risk factors. SABRA LDL-C is now calculated using the Martin-Hopkins  calculation, which is a validated novel method providing  better accuracy than the Friedewald equation in the  estimation of LDL-C.  Gladis APPLETHWAITE et al. SANDREA. 7986;689(80): 2061-2068  (http://education.[facility].com/faq/FAQ164)    Lab Results  Component Value Date   TRIG 777 (H) 04/27/2024   Lab Results  Component Value Date   CHOLHDL 6.0 (H) 04/27/2024   Lab Results  Component Value Date   CREATININE 0.99 04/27/2024   No results found for: GFR Lab Results  Component Value Date   MICROALBUR 2.0 04/27/2024      Component Value Date/Time   NA 139 04/27/2024 1518   NA 140 08/10/2012 1329   K 3.8 04/27/2024 1518   K 3.6 08/10/2012 1329   CL 104 04/27/2024 1518   CL 107 08/10/2012 1329  CO2 25 04/27/2024 1518   CO2 23 08/10/2012 1329   GLUCOSE 149 (H) 04/27/2024 1518   GLUCOSE 122 (H) 08/10/2012 1329   BUN 12 04/27/2024 1518   BUN 10 08/10/2012 1329   CREATININE 0.99 04/27/2024 1518   CALCIUM 9.3 04/27/2024 1518   CALCIUM 8.3 (L) 08/10/2012 1329   PROT 7.3 04/27/2024 1518   PROT 7.2 08/10/2012 1329   ALBUMIN 3.8 09/14/2022 0837   ALBUMIN 3.7 08/10/2012 1329    AST 18 04/27/2024 1518   AST 26 08/10/2012 1329   ALT 27 04/27/2024 1518   ALT 46 08/10/2012 1329   ALKPHOS 45 09/14/2022 0837   ALKPHOS 63 08/10/2012 1329   BILITOT 0.4 04/27/2024 1518   BILITOT 0.3 08/10/2012 1329   GFRNONAA >60 07/03/2023 2155   GFRNONAA 102 07/09/2019 0500   GFRAA 118 07/09/2019 0500      Latest Ref Rng & Units 04/27/2024    3:18 PM 07/03/2023    9:55 PM 06/23/2023    6:55 AM  BMP  Glucose 65 - 99 mg/dL 850  829  878   BUN 7 - 25 mg/dL 12  15  9    Creatinine 0.70 - 1.30 mg/dL 9.00  8.97  9.04   BUN/Creat Ratio 6 - 22 (calc) SEE NOTE:     Sodium 135 - 146 mmol/L 139  137  136   Potassium 3.5 - 5.3 mmol/L 3.8  3.9  3.4   Chloride 98 - 110 mmol/L 104  104  107   CO2 20 - 32 mmol/L 25  22  21    Calcium 8.6 - 10.3 mg/dL 9.3  9.0  8.7        Component Value Date/Time   WBC 8.5 07/03/2023 2155   RBC 5.49 07/03/2023 2155   HGB 15.6 07/03/2023 2155   HGB 15.0 08/10/2012 1329   HCT 45.8 07/03/2023 2155   HCT 43.9 08/10/2012 1329   PLT 321 07/03/2023 2155   PLT 216 08/10/2012 1329   MCV 83.4 07/03/2023 2155   MCV 83 08/10/2012 1329   MCH 28.4 07/03/2023 2155   MCHC 34.1 07/03/2023 2155   RDW 14.2 07/03/2023 2155   RDW 14.7 (H) 08/10/2012 1329   LYMPHSABS 2.1 06/23/2023 0655   MONOABS 0.4 06/23/2023 0655   EOSABS 0.1 06/23/2023 0655   BASOSABS 0.0 06/23/2023 0655     Parts of this note may have been dictated using voice recognition software. There may be variances in spelling and vocabulary which are unintentional. Not all errors are proofread. Please notify the dino if any discrepancies are noted or if the meaning of any statement is not clear.   "

## 2024-06-30 ENCOUNTER — Telehealth: Payer: Self-pay

## 2024-06-30 NOTE — Telephone Encounter (Signed)
 Pt needs Pa done for Dexcom g7.

## 2024-07-01 ENCOUNTER — Other Ambulatory Visit (HOSPITAL_COMMUNITY): Payer: Self-pay

## 2024-07-01 ENCOUNTER — Telehealth: Payer: Self-pay

## 2024-07-01 NOTE — Telephone Encounter (Signed)
 Pharmacy Patient Advocate Encounter   Received notification from Pt Calls Messages that prior authorization for Dexcom g7 sensor is required/requested.   Insurance verification completed.   The patient is insured through [facility].   Per test claim: PA required; PA submitted to above mentioned insurance via Latent Key/confirmation #/EOC [id number] Status is pending

## 2024-07-06 ENCOUNTER — Other Ambulatory Visit

## 2024-07-07 LAB — COMPREHENSIVE METABOLIC PANEL WITH GFR
AG Ratio: 2 (calc) (ref 1.0–2.5)
ALT: 28 U/L (ref 9–46)
AST: 19 U/L (ref 10–35)
Albumin: 4.8 g/dL (ref 3.6–5.1)
Alkaline phosphatase (APISO): 56 U/L (ref 35–144)
BUN: 12 mg/dL (ref 7–25)
CO2: 27 mmol/L (ref 20–32)
Calcium: 9.6 mg/dL (ref 8.6–10.3)
Chloride: 100 mmol/L (ref 98–110)
Creat: 0.91 mg/dL (ref 0.70–1.30)
Globulin: 2.4 g/dL (ref 1.9–3.7)
Glucose, Bld: 151 mg/dL — ABNORMAL HIGH (ref 65–99)
Potassium: 3.8 mmol/L (ref 3.5–5.3)
Sodium: 136 mmol/L (ref 135–146)
Total Bilirubin: 0.4 mg/dL (ref 0.2–1.2)
Total Protein: 7.2 g/dL (ref 6.1–8.1)
eGFR: 101 mL/min/1.73m2

## 2024-07-07 LAB — LIPID PANEL
Cholesterol: 174 mg/dL
HDL: 32 mg/dL — ABNORMAL LOW
Non-HDL Cholesterol (Calc): 142 mg/dL — ABNORMAL HIGH
Total CHOL/HDL Ratio: 5.4 (calc) — ABNORMAL HIGH
Triglycerides: 528 mg/dL — ABNORMAL HIGH

## 2024-07-07 LAB — HEMOGLOBIN A1C
Hgb A1c MFr Bld: 7.7 % — ABNORMAL HIGH
Mean Plasma Glucose: 174 mg/dL
eAG (mmol/L): 9.7 mmol/L

## 2024-07-23 ENCOUNTER — Ambulatory Visit: Payer: Self-pay

## 2024-07-23 NOTE — Telephone Encounter (Signed)
 FYI Only or Action Required?: FYI only for provider: will maintain upcoming appointment in March and call back if needs triage.  Called Nurse Triage reporting Medication Refill (/).   Triage Disposition: Home Care  Patient/caregiver understands and will follow disposition?: Yes   Copied from CRM [id number]. Topic: Clinical - Medication Question >> Jul 23, 2024  4:15 PM Victoria B wrote: Reason for CRM: Patient's emergency contact called in has ne apt scheduled 03/04 but is running out of his meds, metFORMIN (GLUCOPHAGE) 500 MG table and Jaudiance. [id number] - [place], [place] - [address] AT SEC [address]  Phone: [phone] Fax: [phone]   Answer Assessment - Initial Assessment Questions Patient denies any changes or new symptoms.  Just picked up medications at pharmacy but not sure if he got everything he will need.  Grocery shopping currently and will check later.   Was scheduled to establish care in December at [facility] but this was canceled and had to reschedule.  Cannot see new provider (establish care) until March now.  Protocols used: Medication Refill and Renewal Call-A-AH  Reason for Disposition  [1] Caller requesting a prescription renewal (no refills left), no triage required, AND [2] triager able to renew prescription per department policy  Answer Assessment - Initial Assessment Questions Patient denies any changes or new symptoms.  Just picked up medications at pharmacy but not sure if he got everything he will need.  Grocery shopping currently and will check later.   Was scheduled to establish care in December at [facility] but this was canceled and had to reschedule.  Cannot see new provider (establish care) until March now.  Protocols used: Medication Refill and Renewal Call-A-AH  Reason for Disposition  [1] Caller requesting a prescription renewal (no refills left), no triage required, AND [2]  triager able to renew prescription per department policy    Will need to establish care first per Team Lead Triage RN.  Answer Assessment - Initial Assessment Questions Patient denies any changes or new symptoms.  Just picked up medications at pharmacy but not sure if he got everything he will need.  Grocery shopping currently and will check later.   Was scheduled to establish care in December at [facility] but this was canceled and had to reschedule.  Cannot see new provider (establish care) until March now.  Protocols used: Medication Refill and Renewal Call-A-AH

## 2024-07-24 ENCOUNTER — Other Ambulatory Visit: Payer: Self-pay

## 2024-07-24 NOTE — Progress Notes (Signed)
 Edwin Norton                                          MRN: [id number]   07/24/2024   The [facility] Quality Team Specialist reviewed this patient medical record for the purposes of chart review for care gap closure. The following were reviewed: abstraction for care gap closure-glycemic status assessment.    [facility] Quality Team

## 2024-07-24 NOTE — Telephone Encounter (Signed)
 Pt made aware.

## 2024-07-24 NOTE — Telephone Encounter (Signed)
 He just saw endocrinology.  Recommend reaching out to their office for refills.

## 2024-09-15 ENCOUNTER — Ambulatory Visit
